# Patient Record
Sex: Male | Born: 1985 | Race: Black or African American | Hispanic: No | Marital: Single | Smoking: Current every day smoker
Health system: Southern US, Community
[De-identification: ages and names within clinical notes are randomized; demographics above are authoritative.]

---
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
## 2004-06-27 ENCOUNTER — Emergency Department (HOSPITAL_COMMUNITY): Admission: EM | Admit: 2004-06-27 | Discharge: 2004-06-27 | Payer: Self-pay | Admitting: Emergency Medicine

## 2004-09-29 ENCOUNTER — Emergency Department (HOSPITAL_COMMUNITY): Admission: EM | Admit: 2004-09-29 | Discharge: 2004-09-30 | Payer: Self-pay | Admitting: Emergency Medicine

## 2005-01-21 ENCOUNTER — Emergency Department (HOSPITAL_COMMUNITY): Admission: EM | Admit: 2005-01-21 | Discharge: 2005-01-22 | Payer: Self-pay | Admitting: Emergency Medicine

## 2005-02-07 ENCOUNTER — Emergency Department (HOSPITAL_COMMUNITY): Admission: EM | Admit: 2005-02-07 | Discharge: 2005-02-07 | Payer: Self-pay | Admitting: Emergency Medicine

## 2005-02-20 ENCOUNTER — Emergency Department (HOSPITAL_COMMUNITY): Admission: EM | Admit: 2005-02-20 | Discharge: 2005-02-20 | Payer: Self-pay | Admitting: Emergency Medicine

## 2005-03-23 ENCOUNTER — Emergency Department (HOSPITAL_COMMUNITY): Admission: EM | Admit: 2005-03-23 | Discharge: 2005-03-23 | Payer: Self-pay | Admitting: Family Medicine

## 2005-08-30 ENCOUNTER — Emergency Department (HOSPITAL_COMMUNITY): Admission: EM | Admit: 2005-08-30 | Discharge: 2005-08-30 | Payer: Self-pay | Admitting: Family Medicine

## 2008-03-27 ENCOUNTER — Emergency Department (HOSPITAL_COMMUNITY): Admission: EM | Admit: 2008-03-27 | Discharge: 2008-03-27 | Payer: Self-pay | Admitting: Family Medicine

## 2010-05-04 ENCOUNTER — Emergency Department (HOSPITAL_COMMUNITY)
Admission: EM | Admit: 2010-05-04 | Discharge: 2010-05-04 | Disposition: A | Discharge status: Home / Self Care | Payer: Self-pay | Attending: Emergency Medicine | Admitting: Emergency Medicine

## 2010-05-04 ENCOUNTER — Emergency Department (HOSPITAL_COMMUNITY): Payer: Self-pay

## 2010-05-04 DIAGNOSIS — F17200 Nicotine dependence, unspecified, uncomplicated: Secondary | ICD-10-CM | POA: Insufficient documentation

## 2010-05-04 DIAGNOSIS — R55 Syncope and collapse: Secondary | ICD-10-CM | POA: Insufficient documentation

## 2010-05-04 DIAGNOSIS — R569 Unspecified convulsions: Secondary | ICD-10-CM | POA: Insufficient documentation

## 2010-05-04 DIAGNOSIS — R51 Headache: Secondary | ICD-10-CM | POA: Insufficient documentation

## 2010-05-04 LAB — COMPREHENSIVE METABOLIC PANEL
ALT: 31 U/L (ref 0–53)
AST: 101 U/L — ABNORMAL HIGH (ref 0–37)
Albumin: 4.5 g/dL (ref 3.5–5.2)
Alkaline Phosphatase: 88 U/L (ref 39–117)
BUN: 6 mg/dL (ref 6–23)
CO2: 30 mEq/L (ref 19–32)
Calcium: 10.3 mg/dL (ref 8.4–10.5)
Chloride: 103 mEq/L (ref 96–112)
Creatinine, Ser: 1.1 mg/dL (ref 0.4–1.5)
GFR calc Af Amer: >60 mL/min (ref >60)
GFR calc non Af Amer: >60 mL/min (ref >60)
Glucose, Bld: 106 mg/dL — ABNORMAL HIGH (ref 70–99)
Potassium: 4.2 mEq/L (ref 3.5–5.1)
Sodium: 143 mEq/L (ref 135–145)
Total Bilirubin: 0.7 mg/dL (ref 0.3–1.2)
Total Protein: 8.4 g/dL — ABNORMAL HIGH (ref 6.0–8.3)

## 2010-05-04 LAB — CBC
HCT: 47.2 % (ref 39.0–52.0)
Hemoglobin: 16.1 g/dL (ref 13.0–17.0)
MCH: 29.9 pg (ref 26.0–34.0)
MCHC: 34.1 g/dL (ref 30.0–36.0)
MCV: 87.7 fL (ref 78.0–100.0)
Platelets: 292 10*3/uL (ref 150–400)
RBC: 5.38 MIL/uL (ref 4.22–5.81)
RDW: 13.3 % (ref 11.5–15.5)
WBC: 15 10*3/uL — ABNORMAL HIGH (ref 4.0–10.5)

## 2010-05-04 LAB — DIFFERENTIAL
Basophils Absolute: 0.2 K/uL — ABNORMAL HIGH (ref 0.0–0.1)
Basophils Relative: 1 % (ref 0–1)
Eosinophils Absolute: 0.2 K/uL (ref 0.0–0.7)
Eosinophils Relative: 1 % (ref 0–5)
Lymphocytes Relative: 17 % (ref 12–46)
Lymphs Abs: 2.6 K/uL (ref 0.7–4.0)
Monocytes Absolute: 2.6 K/uL — ABNORMAL HIGH (ref 0.1–1.0)
Monocytes Relative: 17 % — ABNORMAL HIGH (ref 3–12)
Neutro Abs: 9.4 K/uL — ABNORMAL HIGH (ref 1.7–7.7)
Neutrophils Relative %: 64 % (ref 43–77)

## 2010-05-04 LAB — URINALYSIS, ROUTINE W REFLEX MICROSCOPIC
Bilirubin Urine: NEGATIVE
Hgb urine dipstick: NEGATIVE
Nitrite: NEGATIVE
Protein, ur: 100 mg/dL — AB
Urobilinogen, UA: 0.2 mg/dL (ref 0.0–1.0)

## 2010-05-04 LAB — RAPID URINE DRUG SCREEN, HOSP PERFORMED
Amphetamines: NOT DETECTED
Barbiturates: NOT DETECTED
Benzodiazepines: NOT DETECTED
Opiates: NOT DETECTED
Tetrahydrocannabinol: POSITIVE — AB

## 2010-05-04 LAB — ETHANOL

## 2010-12-27 ENCOUNTER — Inpatient Hospital Stay (INDEPENDENT_AMBULATORY_CARE_PROVIDER_SITE_OTHER)
Admission: RE | Admit: 2010-12-27 | Discharge: 2010-12-27 | Disposition: A | Discharge status: Home / Self Care | Payer: Self-pay | Source: Ambulatory Visit | Attending: Emergency Medicine | Admitting: Emergency Medicine

## 2010-12-27 DIAGNOSIS — J029 Acute pharyngitis, unspecified: Secondary | ICD-10-CM

## 2012-02-27 IMAGING — CT CT HEAD W/O CM
1 series · 16 of 30 positions shown, 20 images · non-contrast
Comparison: None.

CLINICAL DATA: Syncope, seizure and headaches for 1 day.

CT HEAD WITHOUT CONTRAST
TECHNIQUE: Contiguous axial images were obtained from the base of
the skull through the vertex without contrast.

[Series 2: head_seq 4.5 h37s st · axial · 0.43mm/px · z∈[-131,+13]mm · 16 of 36 slices shown, 20 images]
[im 2/36  brain]
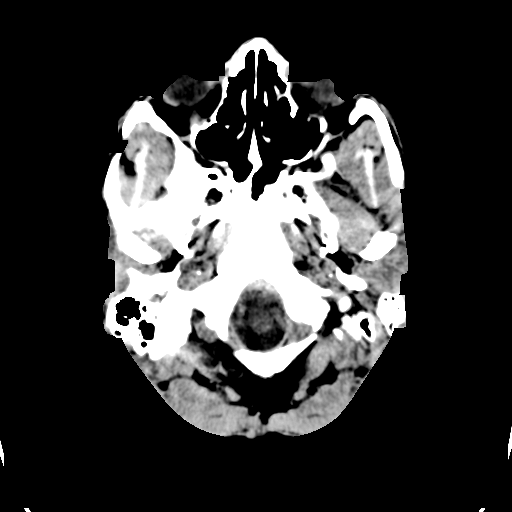
[im 2/36  bone]
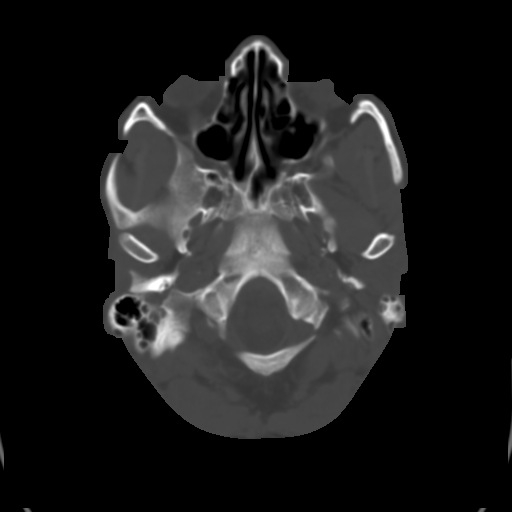
[im 4/36  brain]
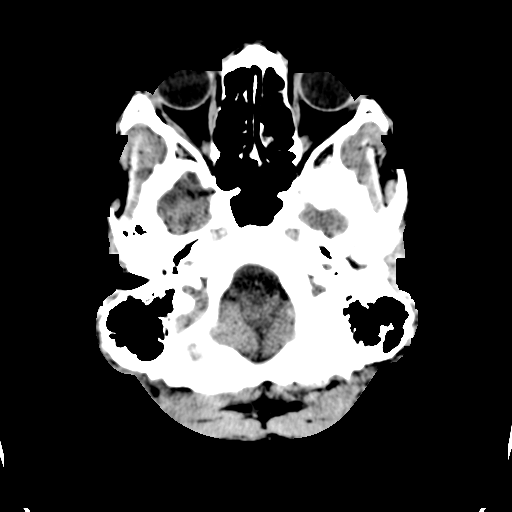
[im 7/36  brain]
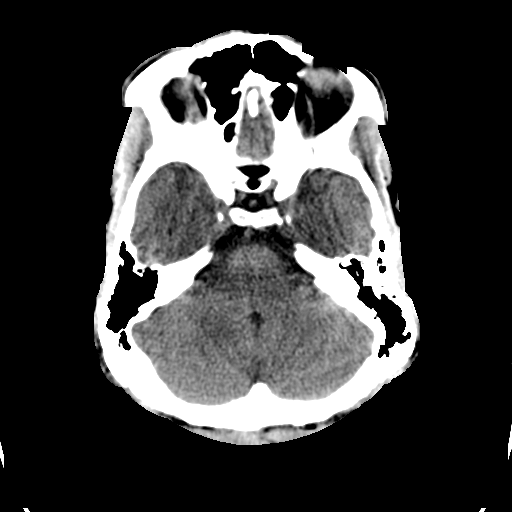
[im 9/36  brain]
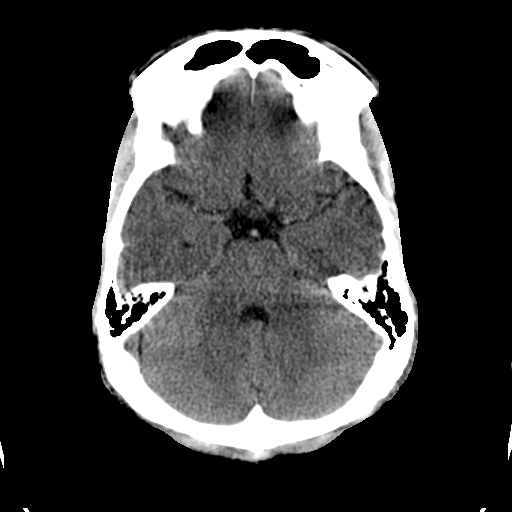
[im 10/36  brain]
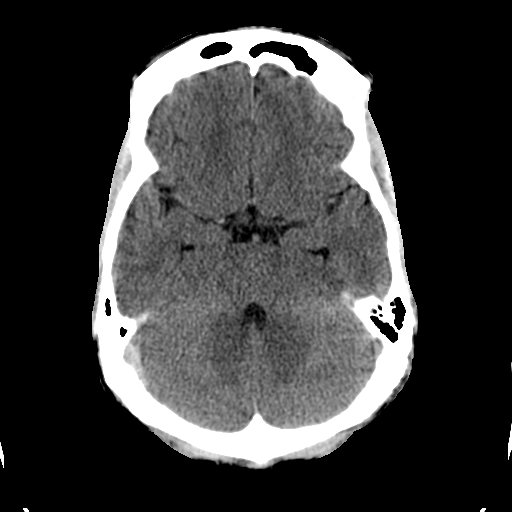
[im 10/36  bone]
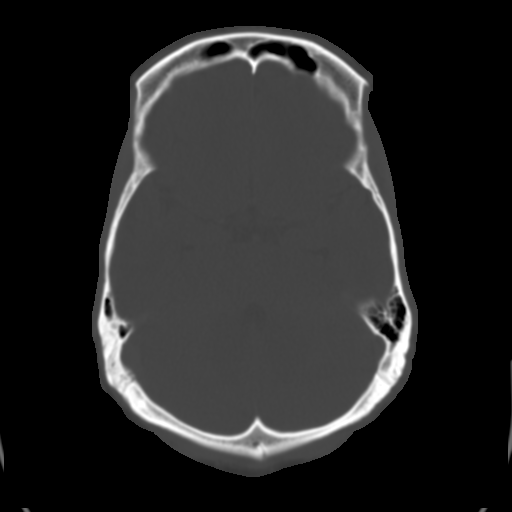
[im 13/36  brain]
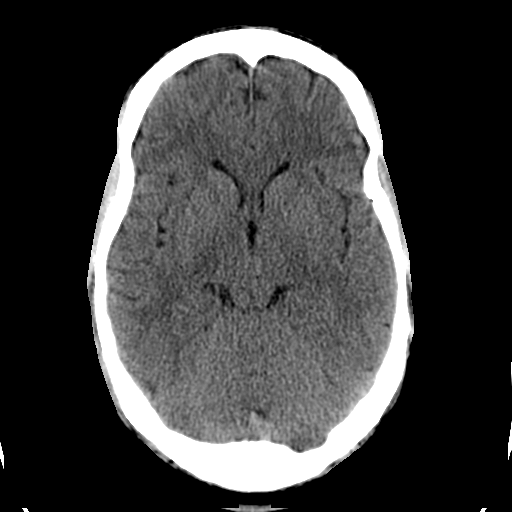
[im 15/36  brain]
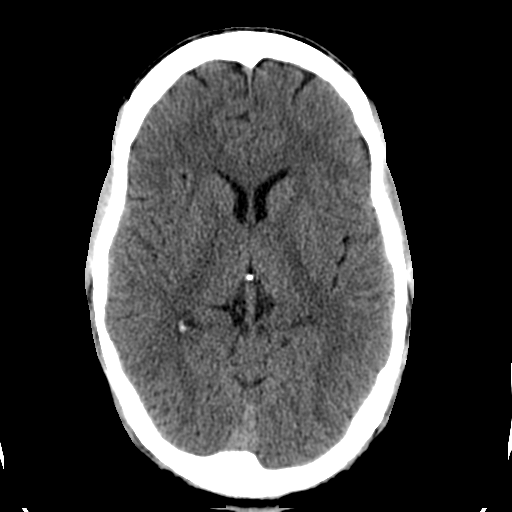
[im 17/36  brain]
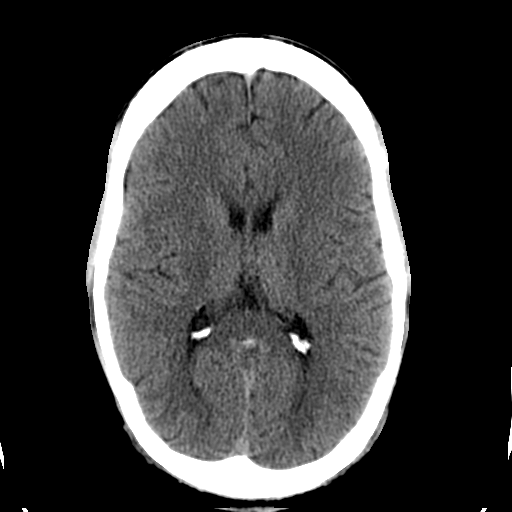
[im 19/36  brain]
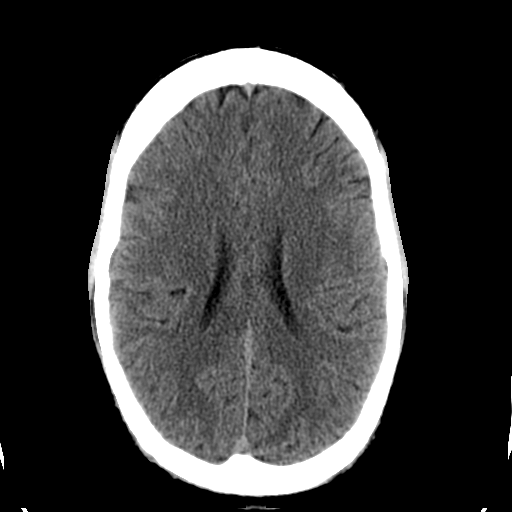
[im 19/36  bone]
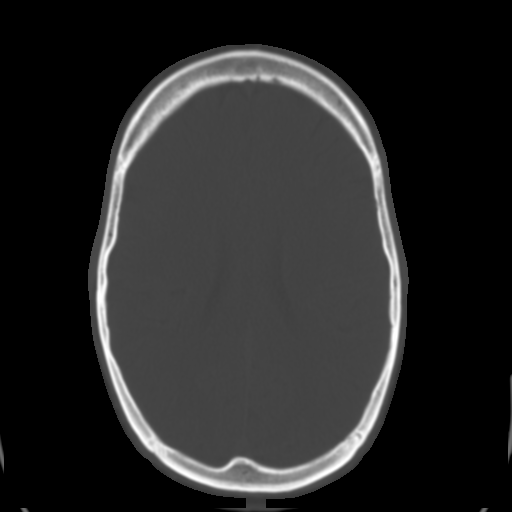
[im 21/36  brain]
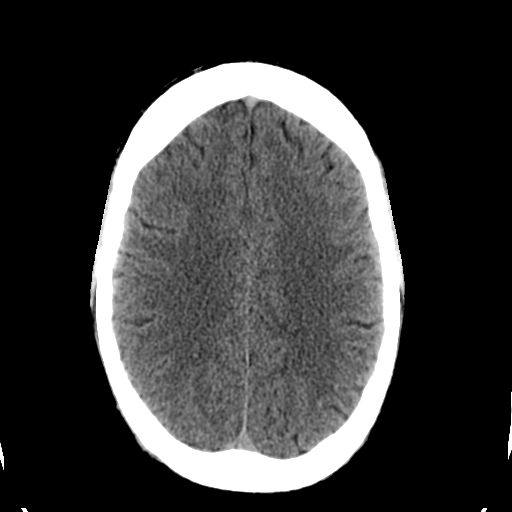
[im 23/36  brain]
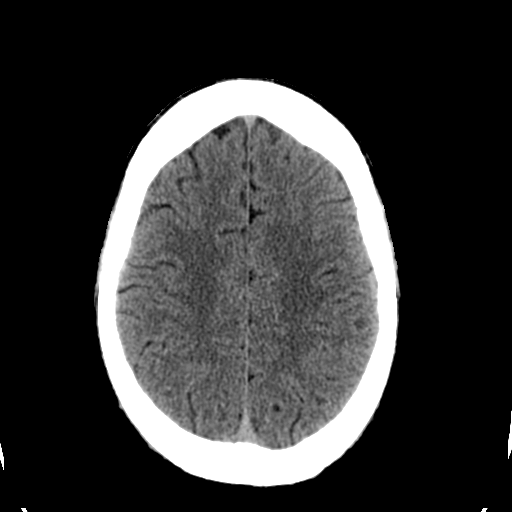
[im 26/36  brain]
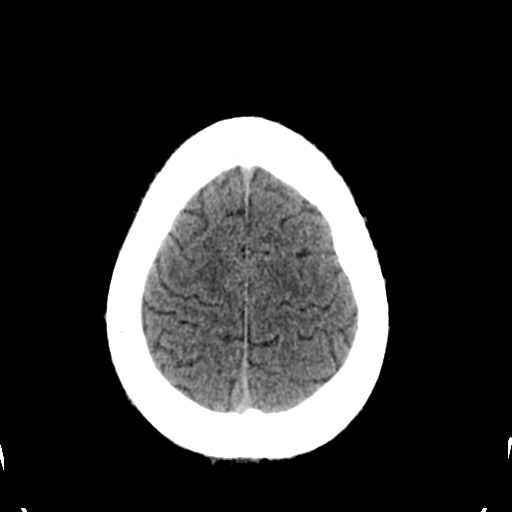
[im 27/36  brain]
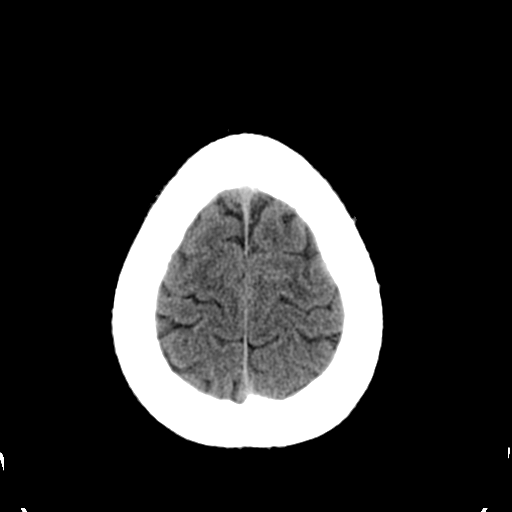
[im 27/36  bone]
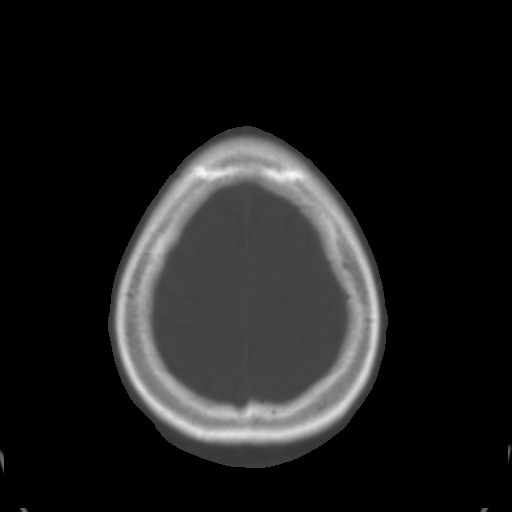
[im 29/36  brain]
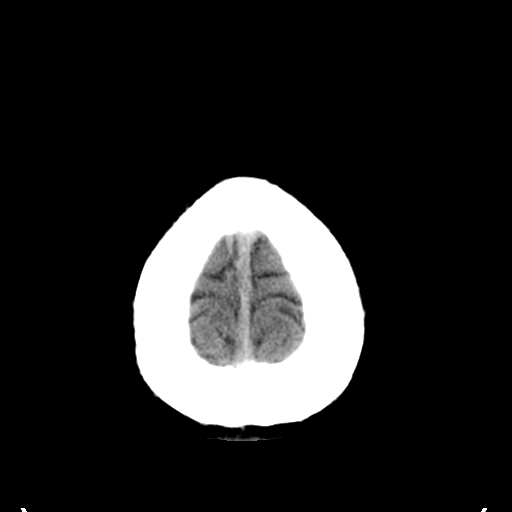
[im 32/36  brain]
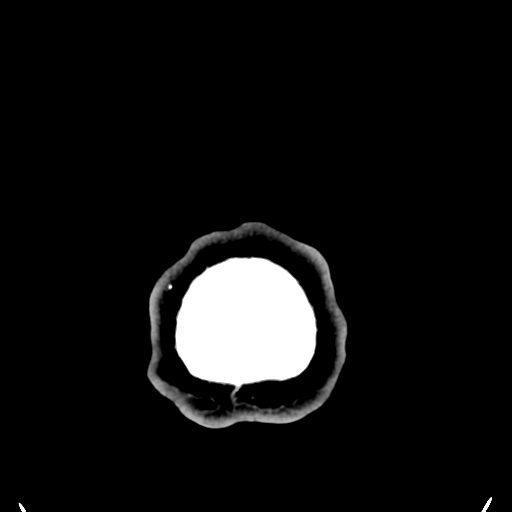
[im 34/36  brain]
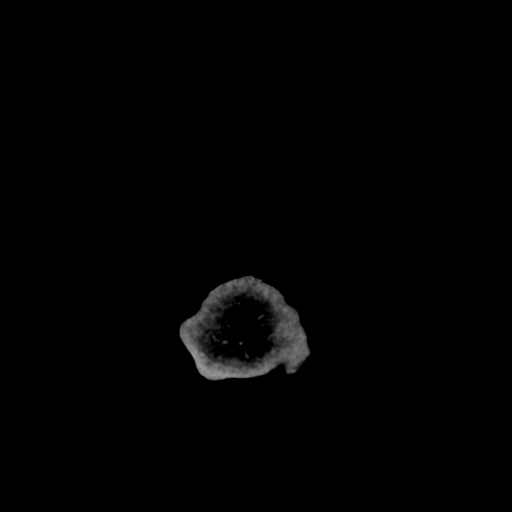

[16 of 30 positions shown; findings below may reference images not displayed]

FINDINGS: There is no evidence of acute intracranial hemorrhage,
mass lesion, brain edema or extra-axial fluid collection.  The
ventricles and subarachnoid spaces are appropriately sized for age.
There is no CT evidence of acute cortical infarction.

The visualized paranasal sinuses are clear. The calvarium is
intact.
IMPRESSION: Unremarkable noncontrast head CT.  Elective MRI may be helpful for
further evaluation of persistent unexplained seizure activity.

## 2014-03-22 ENCOUNTER — Emergency Department (HOSPITAL_COMMUNITY)
Admission: EM | Admit: 2014-03-22 | Discharge: 2014-03-22 | Disposition: A | Discharge status: Home / Self Care | Payer: Self-pay | Attending: Emergency Medicine | Admitting: Emergency Medicine

## 2014-03-22 ENCOUNTER — Encounter (HOSPITAL_COMMUNITY): Payer: Self-pay | Admitting: Emergency Medicine

## 2014-03-22 DIAGNOSIS — A64 Unspecified sexually transmitted disease: Secondary | ICD-10-CM | POA: Insufficient documentation

## 2014-03-22 LAB — URINALYSIS, ROUTINE W REFLEX MICROSCOPIC
Bilirubin Urine: NEGATIVE
Glucose, UA: NEGATIVE mg/dL
Hgb urine dipstick: NEGATIVE
KETONES UR: NEGATIVE mg/dL
NITRITE: NEGATIVE
PH: 7 (ref 5.0–8.0)
Protein, ur: NEGATIVE mg/dL
SPECIFIC GRAVITY, URINE: 1.017 (ref 1.005–1.030)
Urobilinogen, UA: 1 mg/dL (ref 0.0–1.0)

## 2014-03-22 LAB — URINE MICROSCOPIC-ADD ON

## 2014-03-22 MED ORDER — CEFTRIAXONE SODIUM 250 MG IJ SOLR
250.0000 mg | Freq: Once | INTRAMUSCULAR | Status: AC
  Administered 2014-03-22: 250 mg via INTRAMUSCULAR
  Filled 2014-03-22: qty 250

## 2014-03-22 MED ORDER — AZITHROMYCIN 250 MG PO TABS
1000.0000 mg | ORAL_TABLET | Freq: Once | ORAL | Status: AC
  Administered 2014-03-22: 1000 mg via ORAL
  Filled 2014-03-22: qty 4

## 2014-03-22 MED ORDER — LIDOCAINE HCL 1 % IJ SOLN
INTRAMUSCULAR | Status: AC
Start: 2014-03-22 — End: 2014-03-22
  Administered 2014-03-22: 20 mL
  Filled 2014-03-22: qty 20

## 2014-03-22 NOTE — ED Notes (Signed)
Pt c/o dysuria onset this morning, with clear thick pus draining from his penis. Pt endorses unprotected intercourse 4 days ago.

## 2014-03-22 NOTE — ED Provider Notes (Signed)
CSN: [id number]     Arrival date & time 03/22/14  1742 History  This chart was scribed for non-physician practitioner, Earley Favor, FNP,working with Toy Baker, MD, by Karle Plumber, ED Scribe. This patient was seen in room WTR6/WTR6 and the patient's care was started at 8:44 PM.  No chief complaint on file.  The history is provided by the patient. No language interpreter was used.    HPI Comments:  Ricardo Pope is a 29 y.o. male who presents to the Emergency Department complaining of a clear, thick discharge from his penis that he noticed this morning. Reports associated dysuria. He reports he had unprotected sex with a male four days ago and states she told him two days ago that he needs to be checked. Denies testicular pain, abdominal pain, fever, chills, nausea or vomiting.   History reviewed. No pertinent past medical history. No past surgical history on file. History reviewed. No pertinent family history. History  Substance Use Topics  . Smoking status: Not on file  . Smokeless tobacco: Not on file  . Alcohol Use: Not on file    Review of Systems  Constitutional: Negative for fever and chills.  Gastrointestinal: Negative for nausea, vomiting and abdominal pain.  Genitourinary: Positive for dysuria and discharge. Negative for testicular pain.    Allergies  Review of patient's allergies indicates no known allergies.  Home Medications   Prior to Admission medications   Not on File   Triage Vitals: BP 157/90 mmHg  Pulse 105  Temp(Src) 97.9 F (36.6 C) (Oral)  Resp 16  SpO2 100% Physical Exam  Constitutional: He is oriented to person, place, and time. He appears well-developed and well-nourished.  HENT:  Head: Normocephalic and atraumatic.  Eyes: EOM are normal.  Neck: Normal range of motion.  Cardiovascular: Normal rate.   Pulmonary/Chest: Effort normal.  Genitourinary: Circumcised. Discharge found.  Musculoskeletal: Normal range of motion.  Neurological:  He is alert and oriented to person, place, and time.  Skin: Skin is warm and dry.  Psychiatric: He has a normal mood and affect. His behavior is normal.  Nursing note and vitals reviewed.   ED Course  Procedures (including critical care time) DIAGNOSTIC STUDIES: Oxygen Saturation is 100% on RA, normal by my interpretation.   COORDINATION OF CARE: 8:48 PM- Will check and treat for GC/chlamydia and HIV. Pt verbalizes understanding and agrees to plan.  Medications  cefTRIAXone (ROCEPHIN) injection 250 mg (250 mg Intramuscular Given 03/22/14 2108)  azithromycin (ZITHROMAX) tablet 1,000 mg (1,000 mg Oral Given 03/22/14 2107)  lidocaine (XYLOCAINE) 1 % (with pres) injection (20 mLs  Given 03/22/14 2108)    Labs Review Labs Reviewed  GC/CHLAMYDIA PROBE AMP  URINALYSIS, ROUTINE W REFLEX MICROSCOPIC  HIV ANTIBODY (ROUTINE TESTING)  RPR    Imaging Review No results found.   EKG Interpretation None      MDM  Will treat for GC and Chlamydia Final diagnoses:  STD (male)       I personally performed the services described in this documentation, which was scribed in my presence. The recorded information has been reviewed and is accurate.    Arman Filter, NP 03/22/14 2113  Arman Filter, NP 03/22/14 2120  Toy Baker, MD 03/25/14 [phone]

## 2014-03-22 NOTE — Discharge Instructions (Signed)
Sexually Transmitted Disease A sexually transmitted disease (STD) is a disease or infection often passed to another person during sex. However, STDs can be passed through nonsexual ways. An STD can be passed through:  Spit (saliva).  Semen.  Blood.  Mucus from the vagina.  Pee (urine). HOW CAN I LESSEN MY CHANCES OF GETTING AN STD?  Use:  Latex condoms.  Water-soluble lubricants with condoms. Do not use petroleum jelly or oils.  Dental dams. These are small pieces of latex that are used as a barrier during oral sex.  Avoid having more than one sex partner.  Do not have sex with someone who has other sex partners.  Do not have sex with anyone you do not know or who is at high risk for an STD.  Avoid risky sex that can break your skin.  Do not have sex if you have open sores on your mouth or skin.  Avoid drinking too much alcohol or taking illegal drugs. Alcohol and drugs can affect your good judgment.  Avoid oral and anal sex acts.  Get shots (vaccines) for HPV and hepatitis.  If you are at risk of being infected with HIV, it is advised that you take a certain medicine daily to prevent HIV infection. This is called pre-exposure prophylaxis (PrEP). You may be at risk if:  You are a man who has sex with other men (MSM).  You are attracted to the opposite sex (heterosexual) and are having sex with more than one partner.  You take drugs with a needle.  You have sex with someone who has HIV.  Talk with your doctor about if you are at high risk of being infected with HIV. If you begin to take PrEP, get tested for HIV first. Get tested every 3 months for as long as you are taking PrEP. WHAT SHOULD I DO IF I THINK I HAVE AN STD?  See your doctor.  Tell your sex partner(s) that you have an STD. They should be tested and treated.  Do not have sex until your doctor says it is okay. WHEN SHOULD I GET HELP? Get help right away if:  You have bad belly (abdominal)  pain.  You are a man and have puffiness (swelling) or pain in your testicles.  You are a woman and have puffiness in your vagina. Document Released: 04/12/2004 Document Revised: 03/10/2013 Document Reviewed: 08/29/2012 Exit[employer] Patient Information 2015 [employer]. This information is not intended to replace advice given to you by your health care provider. Make sure you discuss any questions you have with your health care provider.  

## 2014-03-23 LAB — RPR

## 2014-03-23 LAB — HIV ANTIBODY (ROUTINE TESTING W REFLEX): HIV: NONREACTIVE

## 2014-03-24 LAB — GC/CHLAMYDIA PROBE AMP
CT PROBE, AMP APTIMA: NEGATIVE
GC Probe RNA: NEGATIVE

## 2015-02-21 ENCOUNTER — Encounter: Payer: Self-pay | Admitting: Family Medicine

## 2017-07-10 ENCOUNTER — Encounter (HOSPITAL_COMMUNITY): Payer: Self-pay

## 2017-07-10 ENCOUNTER — Other Ambulatory Visit: Payer: Self-pay

## 2017-07-10 DIAGNOSIS — J189 Pneumonia, unspecified organism: Secondary | ICD-10-CM | POA: Insufficient documentation

## 2017-07-10 DIAGNOSIS — R0781 Pleurodynia: Secondary | ICD-10-CM | POA: Diagnosis present

## 2017-07-10 DIAGNOSIS — F17210 Nicotine dependence, cigarettes, uncomplicated: Secondary | ICD-10-CM | POA: Insufficient documentation

## 2017-07-10 LAB — CBC
HCT: 40.7 % (ref 39.0–52.0)
Hemoglobin: 13.8 g/dL (ref 13.0–17.0)
MCH: 30.3 pg (ref 26.0–34.0)
MCHC: 33.9 g/dL (ref 30.0–36.0)
MCV: 89.3 fL (ref 78.0–100.0)
PLATELETS: 252 10*3/uL (ref 150–400)
RBC: 4.56 MIL/uL (ref 4.22–5.81)
RDW: 13.8 % (ref 11.5–15.5)
WBC: 26.4 10*3/uL — ABNORMAL HIGH (ref 4.0–10.5)

## 2017-07-10 LAB — COMPREHENSIVE METABOLIC PANEL
ALT: 14 U/L — AB (ref 17–63)
AST: 22 U/L (ref 15–41)
Albumin: 4.4 g/dL (ref 3.5–5.0)
Alkaline Phosphatase: 54 U/L (ref 38–126)
Anion gap: 11 (ref 5–15)
BUN: 8 mg/dL (ref 6–20)
CO2: 27 mmol/L (ref 22–32)
CREATININE: 0.91 mg/dL (ref 0.61–1.24)
Calcium: 9.2 mg/dL (ref 8.9–10.3)
Chloride: 101 mmol/L (ref 101–111)
GFR calc Af Amer: >60 mL/min (ref >60)
Glucose, Bld: 126 mg/dL — ABNORMAL HIGH (ref 65–99)
Potassium: 4.3 mmol/L (ref 3.5–5.1)
SODIUM: 139 mmol/L (ref 135–145)
Total Bilirubin: 1.4 mg/dL — ABNORMAL HIGH (ref 0.3–1.2)
Total Protein: 7.8 g/dL (ref 6.5–8.1)

## 2017-07-10 LAB — LIPASE, BLOOD: Lipase: 20 U/L (ref 11–51)

## 2017-07-10 NOTE — ED Notes (Signed)
Patient refused zofran ODT when offered in triage.

## 2017-07-10 NOTE — ED Triage Notes (Signed)
Patient states he began having fever, chills, vomiting, and rib cage pain since approx 0600 today. Pain is worse in ribs when he takes a deep breath.

## 2017-07-11 ENCOUNTER — Emergency Department (HOSPITAL_COMMUNITY)
Admission: EM | Admit: 2017-07-11 | Discharge: 2017-07-11 | Disposition: A | Discharge status: Home / Self Care | Payer: BLUE CROSS/BLUE SHIELD | Attending: Emergency Medicine | Admitting: Emergency Medicine

## 2017-07-11 ENCOUNTER — Emergency Department (HOSPITAL_COMMUNITY): Payer: BLUE CROSS/BLUE SHIELD

## 2017-07-11 DIAGNOSIS — J189 Pneumonia, unspecified organism: Secondary | ICD-10-CM

## 2017-07-11 DIAGNOSIS — J181 Lobar pneumonia, unspecified organism: Secondary | ICD-10-CM

## 2017-07-11 LAB — D-DIMER, QUANTITATIVE: D-Dimer, Quant: 0.48 ug/mL-FEU (ref 0.00–0.50)

## 2017-07-11 MED ORDER — AMOXICILLIN-POT CLAVULANATE 875-125 MG PO TABS: 1.0000 | ORAL_TABLET | Freq: Two times a day (BID) | ORAL | 0 refills | Status: DC

## 2017-07-11 MED ORDER — SODIUM CHLORIDE 0.9 % IV BOLUS
1000.0000 mL | Freq: Once | INTRAVENOUS | Status: AC
  Administered 2017-07-11: 1000 mL via INTRAVENOUS

## 2017-07-11 MED ORDER — AZITHROMYCIN 250 MG PO TABS
500.0000 mg | ORAL_TABLET | Freq: Once | ORAL | Status: AC
  Administered 2017-07-11: 500 mg via ORAL
  Filled 2017-07-11: qty 2

## 2017-07-11 MED ORDER — NAPROXEN 500 MG PO TABS: 500.0000 mg | ORAL_TABLET | Freq: Two times a day (BID) | ORAL | 0 refills | Status: DC | PRN

## 2017-07-11 MED ORDER — KETOROLAC TROMETHAMINE 30 MG/ML IJ SOLN
30.0000 mg | Freq: Once | INTRAMUSCULAR | Status: AC
  Administered 2017-07-11: 30 mg via INTRAVENOUS
  Filled 2017-07-11: qty 1

## 2017-07-11 MED ORDER — AZITHROMYCIN 250 MG PO TABS: 250.0000 mg | ORAL_TABLET | Freq: Every day | ORAL | 0 refills | Status: DC

## 2017-07-11 MED ORDER — AMOXICILLIN-POT CLAVULANATE 875-125 MG PO TABS
1.0000 | ORAL_TABLET | Freq: Once | ORAL | Status: AC
  Administered 2017-07-11: 1 via ORAL
  Filled 2017-07-11: qty 1

## 2017-07-11 MED ORDER — HYDROCODONE-ACETAMINOPHEN 5-325 MG PO TABS: 1.0000 | ORAL_TABLET | Freq: Four times a day (QID) | ORAL | 0 refills | Status: DC | PRN

## 2017-07-11 NOTE — ED Notes (Signed)
Ambulate pt from room to bathroom and back 99%

## 2017-07-11 NOTE — ED Notes (Signed)
Pt informed that urine sample is needed. 

## 2017-07-11 NOTE — Discharge Instructions (Addendum)
Your work-up in the emergency department today is concerning for pneumonia.  We recommend the use of an incentive spirometer at least once per hour while awake to ensure that you are taking deep breaths.  Continue antibiotics as prescribed until finished.  You have been prescribed naproxen to take for pain control.  You may supplement this with Norco as needed for severe pain.    We recommend follow-up with a primary care doctor to ensure resolution of symptoms.  Should you experience worsening symptoms such as increased fever, worsening shortness of breath, coughing up blood, loss of consciousness, return to the emergency department for continued and prompt evaluation.

## 2017-07-11 NOTE — ED Provider Notes (Signed)
[facility]-EMERGENCY DEPT Provider Note   CSN: [id number] Arrival date & time: 07/10/17  1545    History   Chief Complaint Chief Complaint  Patient presents with  . Emesis  . pain in ribs  . Fever  . Chills    HPI Ricardo Pope is a 32 y.o. male.   32 year old male presents to the emergency department for evaluation of pleuritic chest pain.  He reports sudden onset of right sided chest pain at 6 AM yesterday which woke him from sleep.  Patient with feeling of generalized fatigue and malaise upon waking as well.  He felt hot with chills, but did not take his temperature to note if he had a fever.  Patient experienced 4 episodes of vomiting at onset of his symptoms shortly after waking.  He has not experienced any additional nausea or vomiting, but continues to have sharp chest pain with coughing, deep breathing.  It is slightly improved when extending his back to stretch his abdomen.  He took 2 tablets of Advil with no relief of his discomfort.  Patient denies any bowel changes, urinary symptoms, history of abdominal surgeries, recent surgeries or hospitalizations, leg swelling.  He reports being in his normal state of health before going to bed.  He had eaten baked chicken, green beans, rice, potato chips, and a honey bun before going to bed.     History reviewed. No pertinent past medical history.  There are no active problems to display for this patient.   History reviewed. No pertinent surgical history.      Home Medications    Prior to Admission medications   Medication Sig Start Date End Date Taking? Authorizing Provider  amoxicillin-clavulanate (AUGMENTIN) 875-125 MG tablet Take 1 tablet by mouth every 12 (twelve) hours. 07/11/17   Antony Madura, PA-C  azithromycin (ZITHROMAX) 250 MG tablet Take 1 tablet (250 mg total) by mouth daily. Take 1 every day until finished, beginning on the morning of 07/12/17. 07/11/17   Antony Madura, PA-C    HYDROcodone-acetaminophen (NORCO/VICODIN) 5-325 MG tablet Take 1 tablet by mouth every 6 (six) hours as needed for severe pain. 07/11/17   Antony Madura, PA-C  naproxen (NAPROSYN) 500 MG tablet Take 1 tablet (500 mg total) by mouth every 12 (twelve) hours as needed for mild pain or moderate pain. 07/11/17   Antony Madura, PA-C    Family History History reviewed. No pertinent family history.  Social History Social History   Tobacco Use  . Smoking status: Current Every Day Smoker    Packs/day: 0.25    Types: Cigarettes  . Smokeless tobacco: Never Used  Substance Use Topics  . Alcohol use: Yes  . Drug use: Never     Allergies   Patient has no known allergies.   Review of Systems Review of Systems Ten systems reviewed and are negative for acute change, except as noted in the HPI.    Physical Exam Updated Vital Signs BP 99/64   Pulse 74   Temp 99.2 F (37.3 C) (Oral)   Resp 19   Ht 5\' 9"  (1.753 m)   Wt 62.6 kg (138 lb)   SpO2 97%   BMI 20.38 kg/m   Physical Exam  Constitutional: He is oriented to person, place, and time. He appears well-developed and well-nourished. No distress.  Nontoxic appearing and in no acute distress  HENT:  Head: Normocephalic and atraumatic.  Eyes: Conjunctivae and EOM are normal. No scleral icterus.  Neck: Normal range of motion.  Cardiovascular: Normal rate, regular rhythm and intact distal pulses.  Patient not tachycardic as noted in triage  Pulmonary/Chest: Effort normal. No stridor. No respiratory distress. He has no wheezes. He has no rales.  Lungs CTAB  Abdominal:  Abdomen soft, nondistended.  No palpable tenderness.  Negative Murphy sign.  No peritoneal signs or palpable masses on exam.  Musculoskeletal: Normal range of motion.  Neurological: He is alert and oriented to person, place, and time. He exhibits normal muscle tone. Coordination normal.  GCS 15. Patient moving all extremities spontaneously.  Skin: Skin is warm and dry. No  rash noted. He is not diaphoretic. No erythema. No pallor.  Psychiatric: He has a normal mood and affect. His behavior is normal.  Nursing note and vitals reviewed.    ED Treatments / Results  Labs (all labs ordered are listed, but only abnormal results are displayed) Labs Reviewed  COMPREHENSIVE METABOLIC PANEL - Abnormal; Notable for the following components:      Result Value   Glucose, Bld 126 (*)    ALT 14 (*)    Total Bilirubin 1.4 (*)    All other components within normal limits  CBC - Abnormal; Notable for the following components:   WBC 26.4 (*)    All other components within normal limits  LIPASE, BLOOD  D-DIMER, QUANTITATIVE (NOT AT [facility])    EKG None  Radiology Dg Chest 2 View  Result Date: 07/11/2017 CLINICAL DATA:  Right upper quadrant pain, nausea, and fever for several days. EXAM: CHEST - 2 VIEW COMPARISON:  None. FINDINGS: Consolidation in the right middle lung consistent with pneumonia. Heart size and pulmonary vascularity are normal. Left lung is clear. No blunting of costophrenic angles. No pneumothorax. Mediastinal contours appear intact. IMPRESSION: Consolidation in the right middle lung likely represents pneumonia. Electronically Signed   By: Burman NievesWilliam  Stevens M.D.   On: 07/11/2017 01:03   [place] Abdomen Complete  Result Date: 07/11/2017 CLINICAL DATA:  Right upper quadrant pain for 1 day. EXAM: ABDOMEN ULTRASOUND COMPLETE COMPARISON:  None. FINDINGS: Gallbladder: Multiple small gallbladder polyps, largest measuring about 4 mm diameter. Based on size criteria, these are likely benign. No gallstones or wall thickening. Murphy's sign is negative. Common bile duct: Diameter: 4.3 mm, normal Liver: No focal lesion identified. Within normal limits in parenchymal echogenicity. Portal vein is patent on color Doppler imaging with normal direction of blood flow towards the liver. IVC: No abnormality visualized. Pancreas: Visualized portion unremarkable. Spleen: Size and  appearance within normal limits. Right Kidney: Length: 11.3 cm. Echogenicity within normal limits. No mass or hydronephrosis visualized. Left Kidney: Length: 11.8 cm. Echogenicity within normal limits. No mass or hydronephrosis visualized. Abdominal aorta: No aneurysm visualized. Other findings: None. IMPRESSION: 1. Multiple gallbladder polyps measuring up to 4 mm diameter, likely benign. Multiple polyps can be seen with adenomyomatosis. 2. No evidence of cholelithiasis or cholecystitis. Electronically Signed   By: Burman NievesWilliam  Stevens M.D.   On: 07/11/2017 01:39    Procedures Procedures (including critical care time)  Medications Ordered in ED Medications  sodium chloride 0.9 % bolus 1,000 mL (0 mLs Intravenous Stopped 07/11/17 0105)  ketorolac (TORADOL) 30 MG/ML injection 30 mg (30 mg Intravenous Given 07/11/17 0229)  azithromycin (ZITHROMAX) tablet 500 mg (500 mg Oral Given 07/11/17 0228)  amoxicillin-clavulanate (AUGMENTIN) 875-125 MG per tablet 1 tablet (1 tablet Oral Given 07/11/17 0228)  sodium chloride 0.9 % bolus 1,000 mL (0 mLs Intravenous Stopped 07/11/17 0300)    4:27 AM Patient ambulatory in the emergency  department with oxygen saturations of 99%.   Initial Impression / Assessment and Plan / ED Course  I have reviewed the triage vital signs and the nursing notes.  Pertinent labs & imaging results that were available during my care of the patient were reviewed by me and considered in my medical decision making (see chart for details).     32 year old male presents to the emergency department for complaints of right sided pleuritic chest pain.  This began early this morning after returning home from work.  Patient reports feelings of generalized fatigue with subjective fever.  He has been afebrile in the emergency department.  Lungs grossly clear on assessment.  No reproducible abdominal tenderness.  Patient seems to have history of leukocytosis at baseline; however, this is more  significantly elevated today up to 26.4.  No electro light derangements.  Liver and kidney function preserved.  Lipase normal.  Given pleuritic nature of symptoms, a d-dimer was ordered as patient was tachycardic on arrival to the emergency department.  D-dimer today is negative.  Patient with low pretest probability for pulmonary embolus.  I do not believe further evaluation with CT scan is indicated.  He was found to have findings concerning for right middle lobe opacity consistent with pneumonia.  In light of fatigue, leukocytosis will treat with antibiotics.  Patient is able to ambulate in the emergency department without hypoxia.  He has been started on Augmentin and azithromycin.  Patient provided an incentive spirometer to use at least once per hour while awake.  He is stable for follow-up with a primary care doctor.  Return precautions discussed and provided. Patient discharged in stable condition with no unaddressed concerns.   Final Clinical Impressions(s) / ED Diagnoses   Final diagnoses:  Community acquired pneumonia of right middle lobe of lung [facility])    ED Discharge Orders        Ordered    azithromycin (ZITHROMAX) 250 MG tablet  Daily     07/11/17 0430    amoxicillin-clavulanate (AUGMENTIN) 875-125 MG tablet  Every 12 hours     07/11/17 0430    HYDROcodone-acetaminophen (NORCO/VICODIN) 5-325 MG tablet  Every 6 hours PRN     07/11/17 0430    naproxen (NAPROSYN) 500 MG tablet  Every 12 hours PRN     07/11/17 0430       Antony Madura, PA-C 07/11/17 0651    Ward, Layla Maw, DO 07/11/17 [id number]

## 2018-05-06 ENCOUNTER — Other Ambulatory Visit: Payer: Self-pay

## 2018-05-06 ENCOUNTER — Encounter (HOSPITAL_COMMUNITY): Payer: Self-pay

## 2018-05-06 ENCOUNTER — Ambulatory Visit (HOSPITAL_COMMUNITY)
Admission: EM | Admit: 2018-05-06 | Discharge: 2018-05-06 | Disposition: A | Discharge status: Home / Self Care | Payer: BLUE CROSS/BLUE SHIELD | Attending: Family Medicine | Admitting: Family Medicine

## 2018-05-06 DIAGNOSIS — B9789 Other viral agents as the cause of diseases classified elsewhere: Secondary | ICD-10-CM

## 2018-05-06 DIAGNOSIS — J069 Acute upper respiratory infection, unspecified: Secondary | ICD-10-CM | POA: Diagnosis not present

## 2018-05-06 MED ORDER — BENZONATATE 200 MG PO CAPS: 200.0000 mg | ORAL_CAPSULE | Freq: Two times a day (BID) | ORAL | 0 refills | Status: DC | PRN

## 2018-05-06 MED ORDER — DM-GUAIFENESIN ER 30-600 MG PO TB12: 1.0000 | ORAL_TABLET | Freq: Two times a day (BID) | ORAL | 0 refills | Status: DC

## 2018-05-06 NOTE — ED Triage Notes (Addendum)
Pt cc chest congestion and a scratchy  throat x 2 days.

## 2018-05-06 NOTE — Discharge Instructions (Addendum)
Drink plenty of fluids Take both Mucinex and Tessalon every 12 hours Use a humidifier you have one Take ibuprofen 800 mg 3 times a day as needed for any pain or fever Expect improvement over the next few days

## 2018-05-06 NOTE — ED Provider Notes (Signed)
[facility]    CSN: [id number] Arrival date & time: 05/06/18  1555     History   Chief Complaint Chief Complaint  Patient presents with  . chest congestion    HPI Ricardo Pope is a 33 y.o. male.   HPI  Patient states he has a "cold".  Cough cold runny nose chest congestion for 2 days.  No fever chills.  No nausea or vomiting.  No known exposure to influenza or strep.  He states that the people at work of been sick.  He missed work today. He is a smoker and is advised to quit. He is otherwise in good health and states he is not on any ongoing prescription medication  History reviewed. No pertinent past medical history.  There are no active problems to display for this patient.   History reviewed. No pertinent surgical history.     Home Medications    Prior to Admission medications   Medication Sig Start Date End Date Taking? Authorizing Provider  benzonatate (TESSALON) 200 MG capsule Take 1 capsule (200 mg total) by mouth 2 (two) times daily as needed for cough. 05/06/18   Eustace Moore, MD  dextromethorphan-guaiFENesin [facility] DM) 30-600 MG 12hr tablet Take 1 tablet by mouth 2 (two) times daily. 05/06/18   Eustace Moore, MD    Family History History reviewed. No pertinent family history.  Social History Social History   Tobacco Use  . Smoking status: Current Every Day Smoker    Packs/day: 0.25    Types: Cigarettes  . Smokeless tobacco: Never Used  Substance Use Topics  . Alcohol use: Yes  . Drug use: Never     Allergies   Patient has no known allergies.   Review of Systems Review of Systems  Constitutional: Negative for chills and fever.  HENT: Positive for congestion. Negative for ear pain and sore throat.   Eyes: Negative for pain and visual disturbance.  Respiratory: Positive for cough. Negative for shortness of breath.   Cardiovascular: Negative for chest pain and palpitations.  Gastrointestinal: Negative for abdominal  pain and vomiting.  Genitourinary: Negative for dysuria and hematuria.  Musculoskeletal: Negative for arthralgias and back pain.  Skin: Negative for color change and rash.  Neurological: Negative for seizures and syncope.  All other systems reviewed and are negative.    Physical Exam Triage Vital Signs ED Triage Vitals  Enc Vitals Group     BP 05/06/18 1609 138/80     Pulse Rate 05/06/18 1609 88     Resp 05/06/18 1609 18     Temp 05/06/18 1609 98.8 F (37.1 C)     Temp Source 05/06/18 1609 Oral     SpO2 05/06/18 1609 100 %     Weight 05/06/18 1607 137 lb (62.1 kg)     Height --      Head Circumference --      Peak Flow --      Pain Score 05/06/18 1607 4     Pain Loc --      Pain Edu? --      Excl. in GC? --    No data found.  Updated Vital Signs BP 138/80 (BP Location: Right Arm)   Pulse 88   Temp 98.8 F (37.1 C) (Oral)   Resp 18   Wt 62.1 kg   SpO2 100%   BMI 20.23 kg/m     Physical Exam Constitutional:      General: He is not in acute  distress.    Appearance: He is well-developed and normal weight.  HENT:     Head: Normocephalic and atraumatic.     Right Ear: Tympanic membrane and ear canal normal.     Left Ear: Tympanic membrane and ear canal normal.     Nose: Congestion and rhinorrhea present.     Comments: Clear Eyes:     Conjunctiva/sclera: Conjunctivae normal.     Pupils: Pupils are equal, round, and reactive to light.  Neck:     Musculoskeletal: Normal range of motion.  Cardiovascular:     Rate and Rhythm: Normal rate and regular rhythm.     Heart sounds: Normal heart sounds.  Pulmonary:     Effort: Pulmonary effort is normal. No respiratory distress.     Breath sounds: Normal breath sounds.     Comments: Lungs are clear Abdominal:     General: There is no distension.     Palpations: Abdomen is soft.  Musculoskeletal: Normal range of motion.  Lymphadenopathy:     Cervical: Cervical adenopathy present.  Skin:    General: Skin is warm and  dry.  Neurological:     Mental Status: He is alert.  Psychiatric:        Mood and Affect: Mood normal.        Behavior: Behavior normal.      UC Treatments / Results  Labs (all labs ordered are listed, but only abnormal results are displayed) Labs Reviewed - No data to display  EKG None  Radiology No results found.  Procedures Procedures (including critical care time)  Medications Ordered in UC Medications - No data to display  Initial Impression / Assessment and Plan / UC Course  I have reviewed the triage vital signs and the nursing notes.  Pertinent labs & imaging results that were available during my care of the patient were reviewed by me and considered in my medical decision making (see chart for details).    Discussed with patient that he has a "chest cold" which is viral.  Antibiotics will not make him get better faster.  We will treat his symptoms.  Expect improvement over a few days.  Return if worse instead of better f Final Clinical Impressions(s) / UC Diagnoses   Final diagnoses:  Viral upper respiratory tract infection with cough     Discharge Instructions     Drink plenty of fluids Take both Mucinex and Tessalon every 12 hours Use a humidifier you have one Take ibuprofen 800 mg 3 times a day as needed for any pain or fever Expect improvement over the next few days    ED Prescriptions    Medication Sig Dispense Auth. Provider   benzonatate (TESSALON) 200 MG capsule Take 1 capsule (200 mg total) by mouth 2 (two) times daily as needed for cough. 20 capsule Eustace Moore, MD   dextromethorphan-guaiFENesin [id number] DM) 30-600 MG 12hr tablet Take 1 tablet by mouth 2 (two) times daily. 20 tablet Eustace Moore, MD     Controlled Substance Prescriptions [place] Controlled Substance Registry consulted? Not Applicable   Eustace Moore, MD 05/06/18 1655

## 2018-06-09 ENCOUNTER — Encounter (HOSPITAL_COMMUNITY): Payer: Self-pay | Admitting: Emergency Medicine

## 2018-06-09 ENCOUNTER — Ambulatory Visit (INDEPENDENT_AMBULATORY_CARE_PROVIDER_SITE_OTHER): Payer: BLUE CROSS/BLUE SHIELD

## 2018-06-09 ENCOUNTER — Ambulatory Visit (HOSPITAL_COMMUNITY)
Admission: EM | Admit: 2018-06-09 | Discharge: 2018-06-09 | Disposition: A | Discharge status: Home / Self Care | Payer: BLUE CROSS/BLUE SHIELD | Attending: Internal Medicine | Admitting: Internal Medicine

## 2018-06-09 ENCOUNTER — Other Ambulatory Visit: Payer: Self-pay

## 2018-06-09 DIAGNOSIS — R69 Illness, unspecified: Secondary | ICD-10-CM | POA: Insufficient documentation

## 2018-06-09 DIAGNOSIS — R05 Cough: Secondary | ICD-10-CM | POA: Insufficient documentation

## 2018-06-09 DIAGNOSIS — R059 Cough, unspecified: Secondary | ICD-10-CM

## 2018-06-09 DIAGNOSIS — J111 Influenza due to unidentified influenza virus with other respiratory manifestations: Secondary | ICD-10-CM

## 2018-06-09 LAB — INFLUENZA PANEL BY PCR (TYPE A & B)
INFLBPCR: NEGATIVE
Influenza A By PCR: NEGATIVE

## 2018-06-09 MED ORDER — ACETAMINOPHEN 325 MG PO TABS
ORAL_TABLET | ORAL | Status: AC
  Filled 2018-06-09: qty 3

## 2018-06-09 MED ORDER — ACETAMINOPHEN 325 MG PO TABS
975.0000 mg | ORAL_TABLET | Freq: Once | ORAL | Status: AC
Start: 2018-06-09 — End: 2018-06-09
  Administered 2018-06-09: 975 mg via ORAL

## 2018-06-09 NOTE — Discharge Instructions (Signed)
You may take Dayquil or Nyquil for symptoms We will call you when the chest xray report and flu test are back to tell you how we are going to treat. If both are negative, then you need to follow the Corona virus protocol.

## 2018-06-09 NOTE — ED Triage Notes (Addendum)
Patient reports having a uri a month ago.  Patient did feel like he did get better.  However, symptoms are returning: cough, dry cough, patient has runny nose, slight throat congestion, slight sore throat, no ear pain  Patient needing work note

## 2018-06-09 NOTE — ED Provider Notes (Signed)
[facility]    CSN: [id number] Arrival date & time: 06/09/18  1521     History   Chief Complaint Chief Complaint  Patient presents with  . Cough    HPI Ricardo Pope is a 32 y.o. male.   Who present of recurrent cough, HA, rhinitis and mild aches since yesterday. Was completely well from illness 1 month ago but took 2 weeks to get well. He denies SOB, wheezing or recent travel     History reviewed. No pertinent past medical history.  There are no active problems to display for this patient.   History reviewed. No pertinent surgical history.   Home Medications    Prior to Admission medications   Not on File    Family History History reviewed. No pertinent family history.  Social History Social History   Tobacco Use  . Smoking status: Current Every Day Smoker    Packs/day: 0.25    Types: Cigarettes  . Smokeless tobacco: Never Used  Substance Use Topics  . Alcohol use: Yes  . Drug use: Never     Allergies   Patient has no known allergies.   Review of Systems Review of Systems  Constitutional: Positive for chills. Negative for appetite change, diaphoresis and fever.  HENT: Positive for congestion, postnasal drip and rhinorrhea. Negative for ear discharge, ear pain and sore throat.   Respiratory: Positive for cough. Negative for chest tightness, shortness of breath and wheezing.   Cardiovascular: Negative for chest pain.  Gastrointestinal: Negative for abdominal pain, diarrhea, nausea and vomiting.  Musculoskeletal: Positive for myalgias. Negative for neck pain and neck stiffness.  Skin: Negative for rash.  Neurological: Positive for headaches.  Hematological: Negative for adenopathy.   Physical Exam Triage Vital Signs ED Triage Vitals  Enc Vitals Group     BP 06/09/18 1550 (!) 147/95     Pulse Rate 06/09/18 1550 (!) 116     Resp 06/09/18 1550 18     Temp 06/09/18 1550 99.3 F (37.4 C)     Temp Source 06/09/18 1550 Oral     SpO2  06/09/18 1550 99 %     Weight --      Height --      Head Circumference --      Peak Flow --      Pain Score 06/09/18 1545 3     Pain Loc --      Pain Edu? --      Excl. in GC? --    No data found.  Updated Vital Signs BP (!) 147/95 (BP Location: Left Arm)   Pulse (!) 116   Temp 99.3 F (37.4 C) (Oral)   Resp 18   SpO2 99%   Visual Acuity Right Eye Distance:   Left Eye Distance:   Bilateral Distance:    Right Eye Near:   Left Eye Near:    Bilateral Near:     Physical Exam Alert, well kept in NAD, looks ill, but not toxic. He did not realize he had a fever today  EYES- non icterus, conjunctiva's are normal.  ENT- external ears and nose are normal, TM's gray and shiny, Nose with clear mucous and            Mild congestion bilaterally. Throat- clear. No sinus tenderness.  NECK- supple with no nodes LUNG- clear, with no wheezing, rhonchi or rales.  HEART- RRR with no murmurs SKIN- non jaundiced, no rashes or ecchymosis.    UC Treatments / Results  Labs (all labs ordered are listed, but only abnormal results are displayed) Labs Reviewed  INFLUENZA PANEL BY PCR (TYPE A & B)  negative Flu A&B  EKG None  Radiology Dg Chest 2 View  Result Date: 06/09/2018 CLINICAL DATA:  Cough, recurrent EXAM: CHEST - 2 VIEW COMPARISON:  07/11/2017 FINDINGS: The heart size and mediastinal contours are within normal limits. Both lungs are clear. The visualized skeletal structures are unremarkable. IMPRESSION: No acute abnormality of the lungs.  No focal airspace opacity. Electronically Signed   By: Lauralyn Primes M.D.   On: 06/09/2018 16:36    Procedures Procedures  Medications Ordered in UC Medications  acetaminophen (TYLENOL) tablet 975 mg (975 mg Oral Given 06/09/18 1659)    Initial Impression / Assessment and Plan / UC Course  I have reviewed the triage vital signs and the nursing notes. I sent out a flu test and we will inform him of results when back. Final CXR is pending.  Was given Corona virus protocol in witting he needs to follow.  If he gets worse, needs to go to the hospital.   Final Clinical Impressions(s) / UC Diagnoses   Final diagnoses:  Influenza-like illness  Cough     Discharge Instructions     You may take Dayquil or Nyquil for symptoms We will call you when the chest xray report and flu test are back to tell you how we are going to treat. If both are negative, then you need to follow the Corona virus protocol.    8:39 pm- I called pt with his results, needs to follow Corona Virus protocol as noted on the paper work I gave him.  ED Prescriptions    None     Controlled Substance Prescriptions [place] Controlled Substance Registry consulted?    Garey Ham, Cordelia Poche 06/09/18 2040

## 2018-08-12 IMAGING — US US ABDOMEN COMPLETE
1 series · 14 of 25 positions shown · non-contrast
Comparison: None.

CLINICAL DATA: Right upper quadrant pain for 1 day.

EXAM:
ABDOMEN ULTRASOUND COMPLETE

[Series 1: us abdomen complete · 0.19mm/px · 14 of 73 slices shown]
[im 1/73]
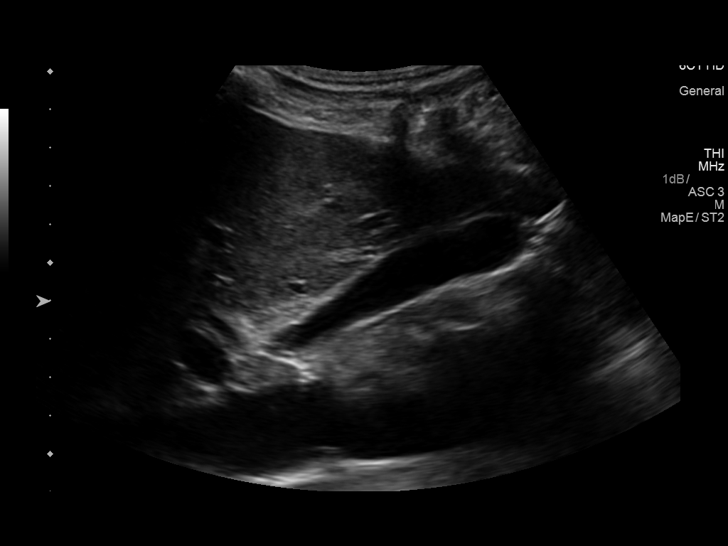
[im 7/73]
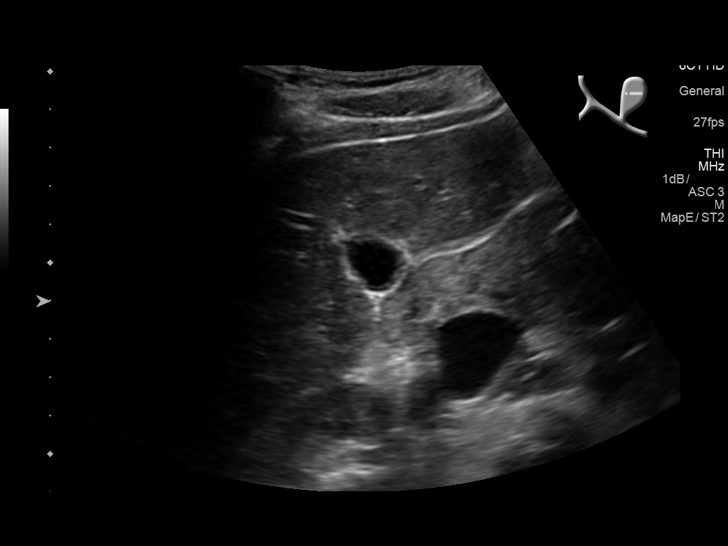
[im 13/73]
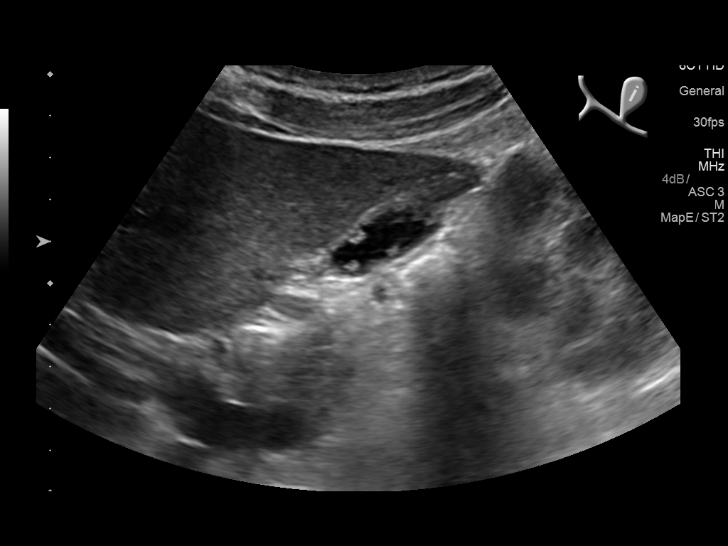
[im 19/73]
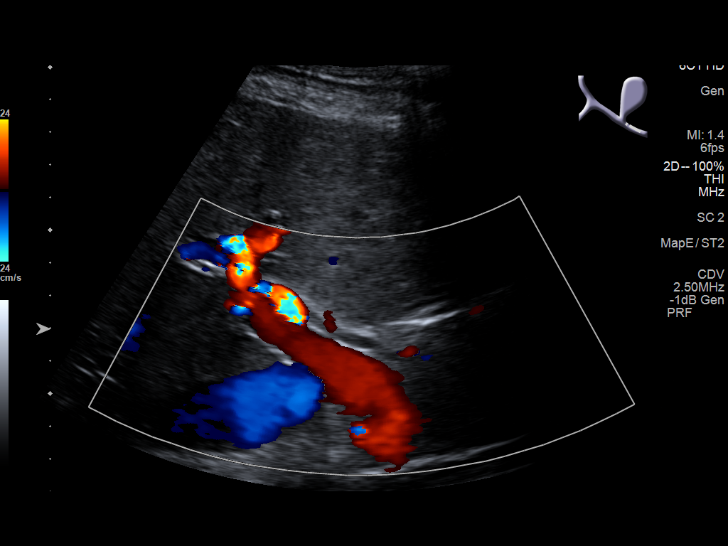
[im 25/73]
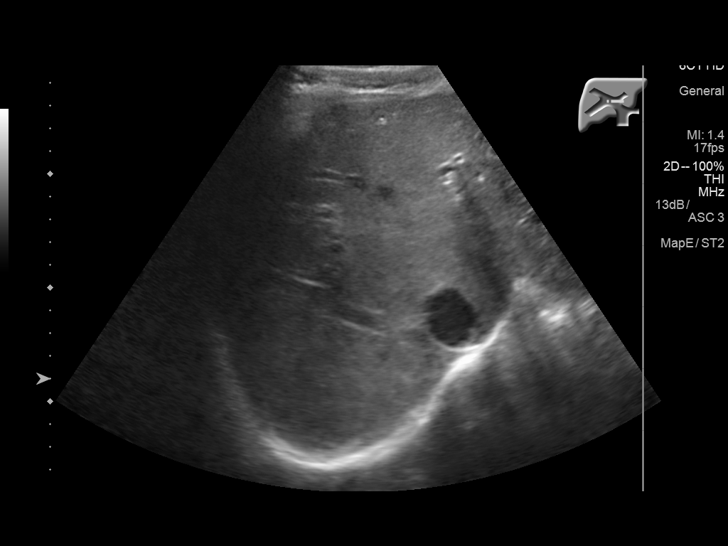
[im 28/73]
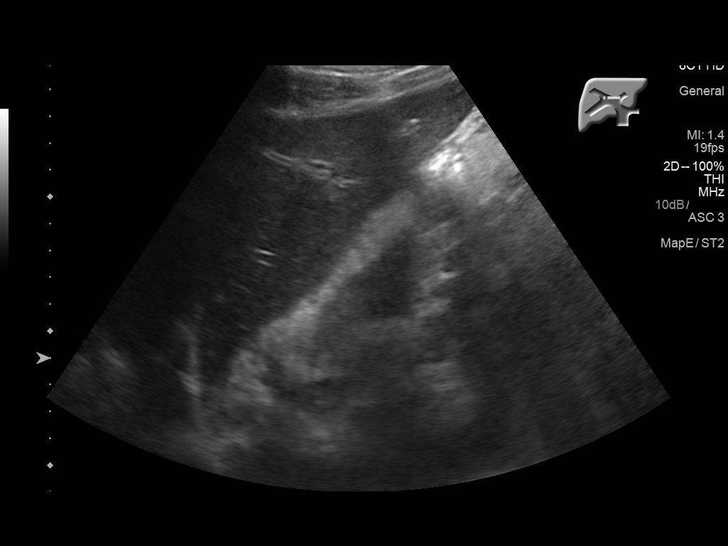
[im 34/73]
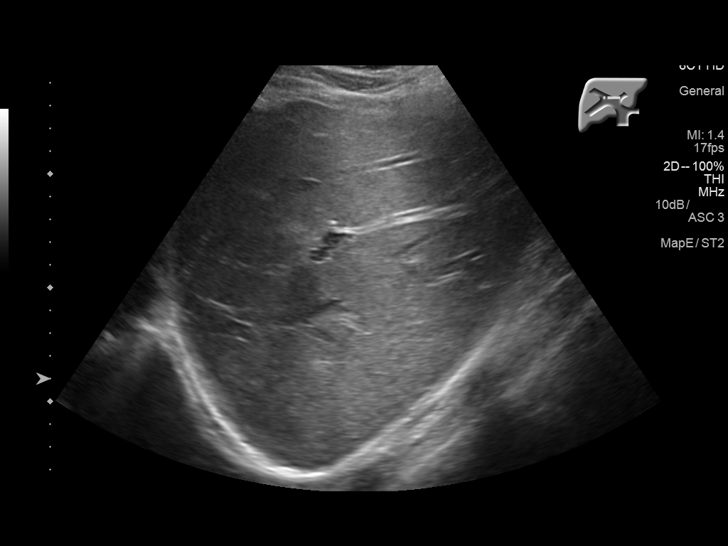
[im 40/73]
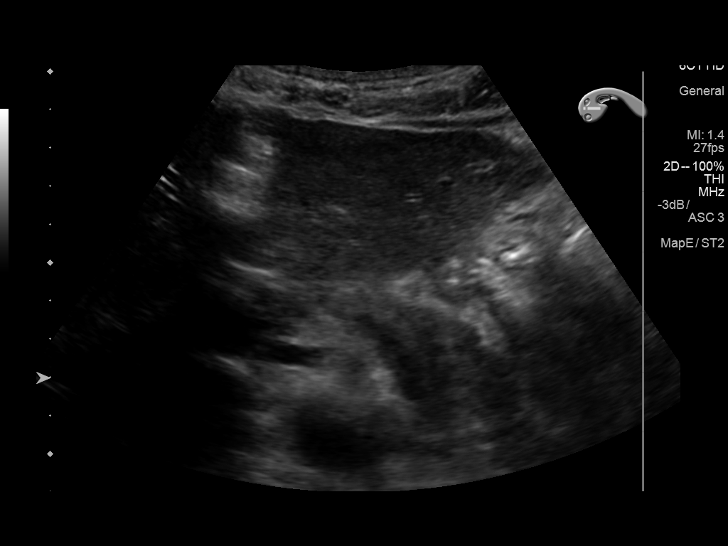
[im 46/73]
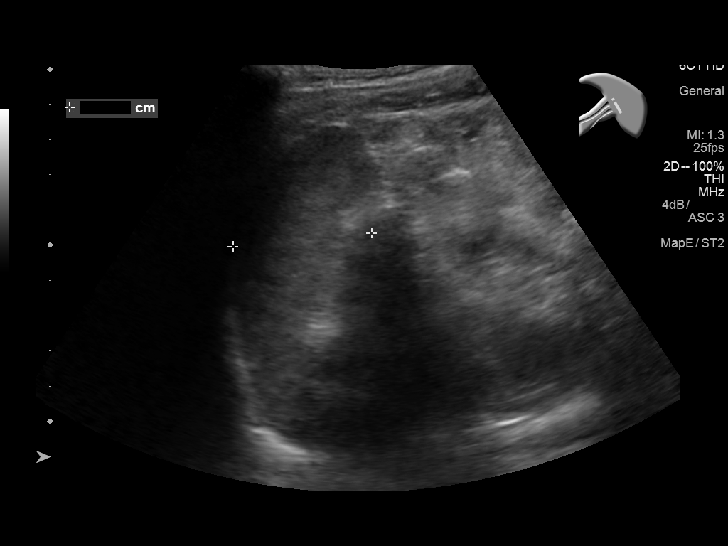
[im 49/73]
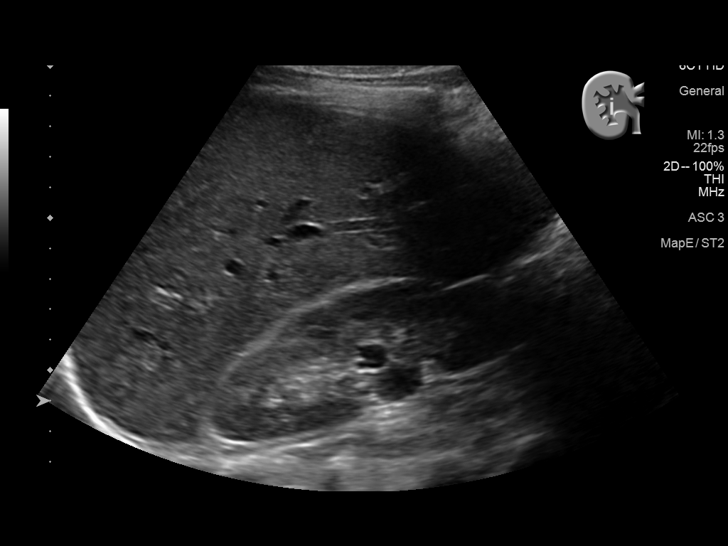
[im 55/73]
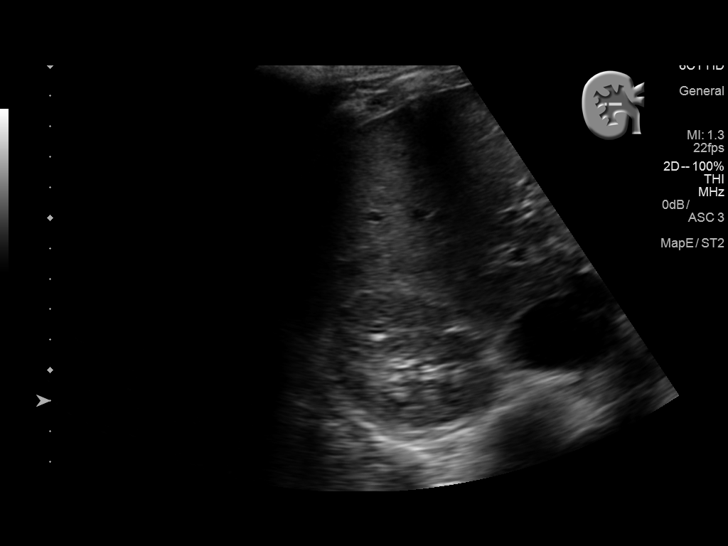
[im 61/73]
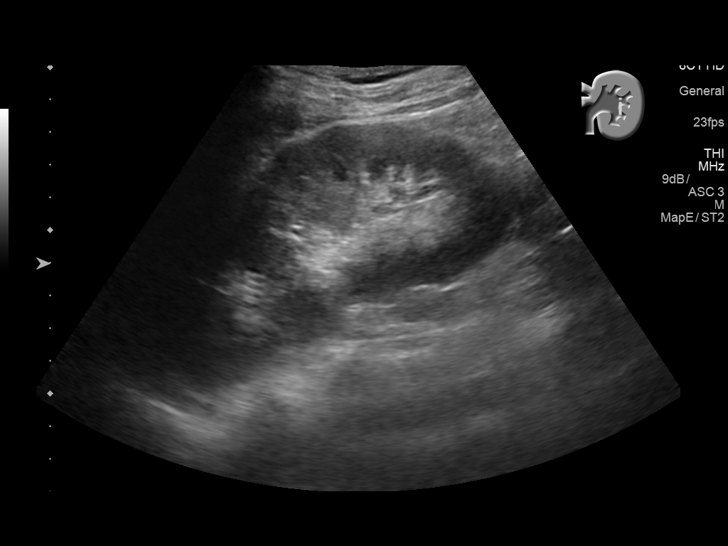
[im 67/73]
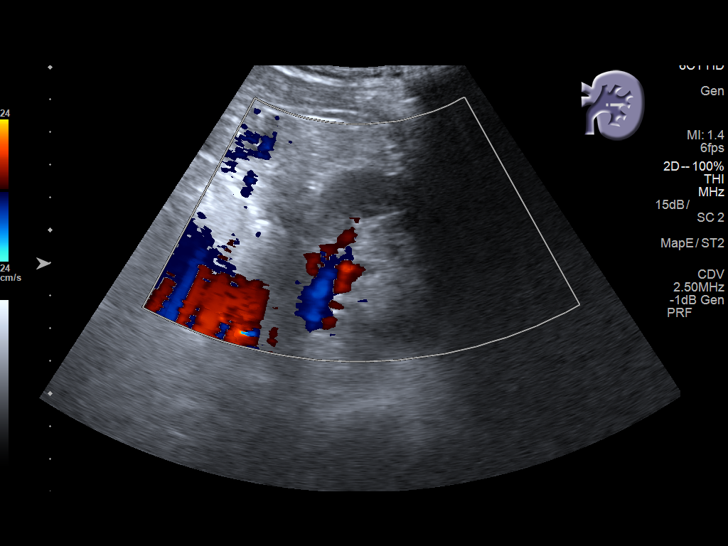
[im 73/73]
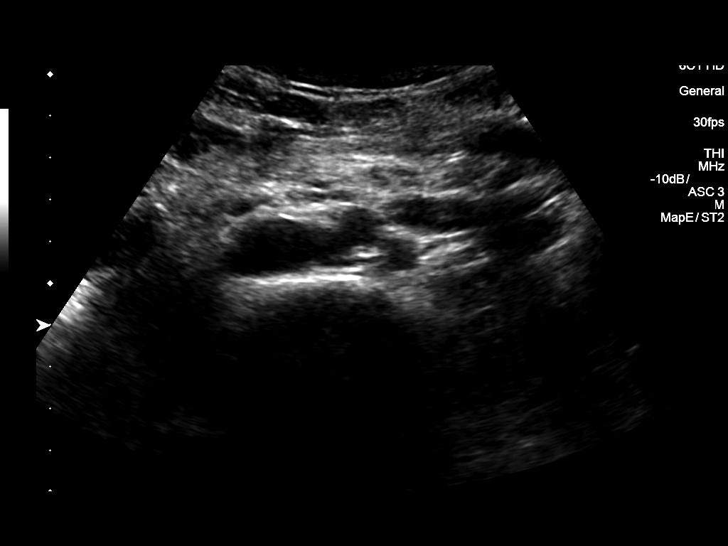

[14 of 25 positions shown; findings below may reference images not displayed]

FINDINGS: Gallbladder: Multiple small gallbladder polyps, largest measuring
about 4 mm diameter. Based on size criteria, these are likely
benign. No gallstones or wall thickening. Murphy's sign is negative.

Common bile duct: Diameter: 4.3 mm, normal

Liver: No focal lesion identified. Within normal limits in
parenchymal echogenicity. Portal vein is patent on color Doppler
imaging with normal direction of blood flow towards the liver.

IVC: No abnormality visualized.

Pancreas: Visualized portion unremarkable.

Spleen: Size and appearance within normal limits.

Right Kidney: Length: 11.3 cm. Echogenicity within normal limits. No
mass or hydronephrosis visualized.

Left Kidney: Length: 11.8 cm. Echogenicity within normal limits. No
mass or hydronephrosis visualized.

Abdominal aorta: No aneurysm visualized.

Other findings: None.
IMPRESSION: 1. Multiple gallbladder polyps measuring up to 4 mm diameter, likely
benign. Multiple polyps can be seen with adenomyomatosis.
2. No evidence of cholelithiasis or cholecystitis.

## 2018-08-23 ENCOUNTER — Ambulatory Visit (HOSPITAL_COMMUNITY)
Admission: EM | Admit: 2018-08-23 | Discharge: 2018-08-23 | Disposition: A | Discharge status: Home / Self Care | Payer: BC Managed Care – PPO | Attending: Family Medicine | Admitting: Family Medicine

## 2018-08-23 ENCOUNTER — Other Ambulatory Visit: Payer: Self-pay

## 2018-08-23 ENCOUNTER — Encounter (HOSPITAL_COMMUNITY): Payer: Self-pay

## 2018-08-23 DIAGNOSIS — R509 Fever, unspecified: Secondary | ICD-10-CM | POA: Diagnosis not present

## 2018-08-23 DIAGNOSIS — J029 Acute pharyngitis, unspecified: Secondary | ICD-10-CM

## 2018-08-23 LAB — POCT RAPID STREP A: Streptococcus, Group A Screen (Direct): POSITIVE — AB

## 2018-08-23 MED ORDER — AMOXICILLIN 875 MG PO TABS: 875.0000 mg | ORAL_TABLET | Freq: Two times a day (BID) | ORAL | 0 refills | Status: AC

## 2018-08-23 NOTE — ED Provider Notes (Addendum)
[facility]   [id number] 08/23/18 Arrival Time: 1128  ASSESSMENT & PLAN:  1. Fever, unspecified fever cause   2. Sore throat    Rapid strep: POSITIVE. No signs of peritonsillar abscess. Discussed.  Meds ordered this encounter  Medications  . amoxicillin (AMOXIL) 875 MG tablet    Sig: Take 1 tablet (875 mg total) by mouth 2 (two) times daily for 10 days.    Dispense:  20 tablet    Refill:  0   See AVS for d/c instructions. OTC analgesics and throat care as needed  Instructed to finish full 10 day course of antibiotics. Will follow up if not showing significant improvement over the next 24-48 hours.  Reviewed expectations re: course of current medical issues. Questions answered. Outlined signs and symptoms indicating need for more acute intervention. Patient verbalized understanding. After Visit Summary given.   SUBJECTIVE:  Ricardo Pope is a 33 y.o. male who reports a sore throat. Describes as "very painful when I swallow". Onset gradual beginning 1-2 days ago. No respiratory symptoms. Overall normal PO intake but reports discomfort with swallowing. No specific alleviating factors. Fever reported: subjective. No neck pain or swelling. No associated n/v/abdominal symptoms. Sick contacts: none known.  OTC treatment: Tylenol with some help.  ROS: As per HPI. All other systems negative.   OBJECTIVE:  Vitals:   08/23/18 1214 08/23/18 1220  BP: 137/87   Pulse: (!) 113   Resp: 18   Temp: (!) 100.4 F (38 C)   SpO2: 100%   Weight:  63.5 kg     General appearance: alert; no distress HEENT: throat with tonsillar hypertrophy, moderate erythema and exudates present; uvula midline: yes Neck: supple with FROM; small bilateral LAD with tenderness CV: mild tachycardia; regular Lungs: clear to auscultation bilaterally Abd: soft; non-tender Skin: reveals no rash; warm and dry Psychological: alert and cooperative; normal mood and affect  No Known Allergies  No  h/o recurrent strep throat.  Social History   Socioeconomic History  . Marital status: Single    Spouse name: Not on file  . Number of children: Not on file  . Years of education: Not on file  . Highest education level: Not on file  Occupational History  . Not on file  Social Needs  . Financial resource strain: Not on file  . Food insecurity:    Worry: Not on file    Inability: Not on file  . Transportation needs:    Medical: Not on file    Non-medical: Not on file  Tobacco Use  . Smoking status: Current Every Day Smoker    Packs/day: 0.25    Types: Cigarettes  . Smokeless tobacco: Never Used  Substance and Sexual Activity  . Alcohol use: Yes  . Drug use: Never  . Sexual activity: Not on file  Lifestyle  . Physical activity:    Days per week: Not on file    Minutes per session: Not on file  . Stress: Not on file  Relationships  . Social connections:    Talks on phone: Not on file    Gets together: Not on file    Attends religious service: Not on file    Active member of club or organization: Not on file    Attends meetings of clubs or organizations: Not on file    Relationship status: Not on file  . Intimate partner violence:    Fear of current or ex partner: Not on file    Emotionally abused:  Not on file    Physically abused: Not on file    Forced sexual activity: Not on file  Other Topics Concern  . Not on file  Social History Narrative  . Not on file   History reviewed. No pertinent family history.        Vanessa Kick, MD 08/23/18 1242    Vanessa Kick, MD 08/23/18 [phone]

## 2018-08-23 NOTE — Discharge Instructions (Signed)
You may use over the counter ibuprofen or acetaminophen as needed.  °For a sore throat, over the counter products such as [employer] Peroxyl Mouth Sore Rinse or Chloraseptic Sore Throat Spray may provide some temporary relief. ° ° ° ° °

## 2018-08-23 NOTE — ED Triage Notes (Signed)
Pt states he has swollen lymph nodes x 2 days. Pt has pain on the left side of his neck.

## 2019-05-06 IMAGING — CR DG CHEST 2V
2 series · 2 of 2 positions shown · non-contrast
Comparison: None.

CLINICAL DATA: Right upper quadrant pain, nausea, and fever for
several days.

EXAM:
CHEST - 2 VIEW

[w chest pa]
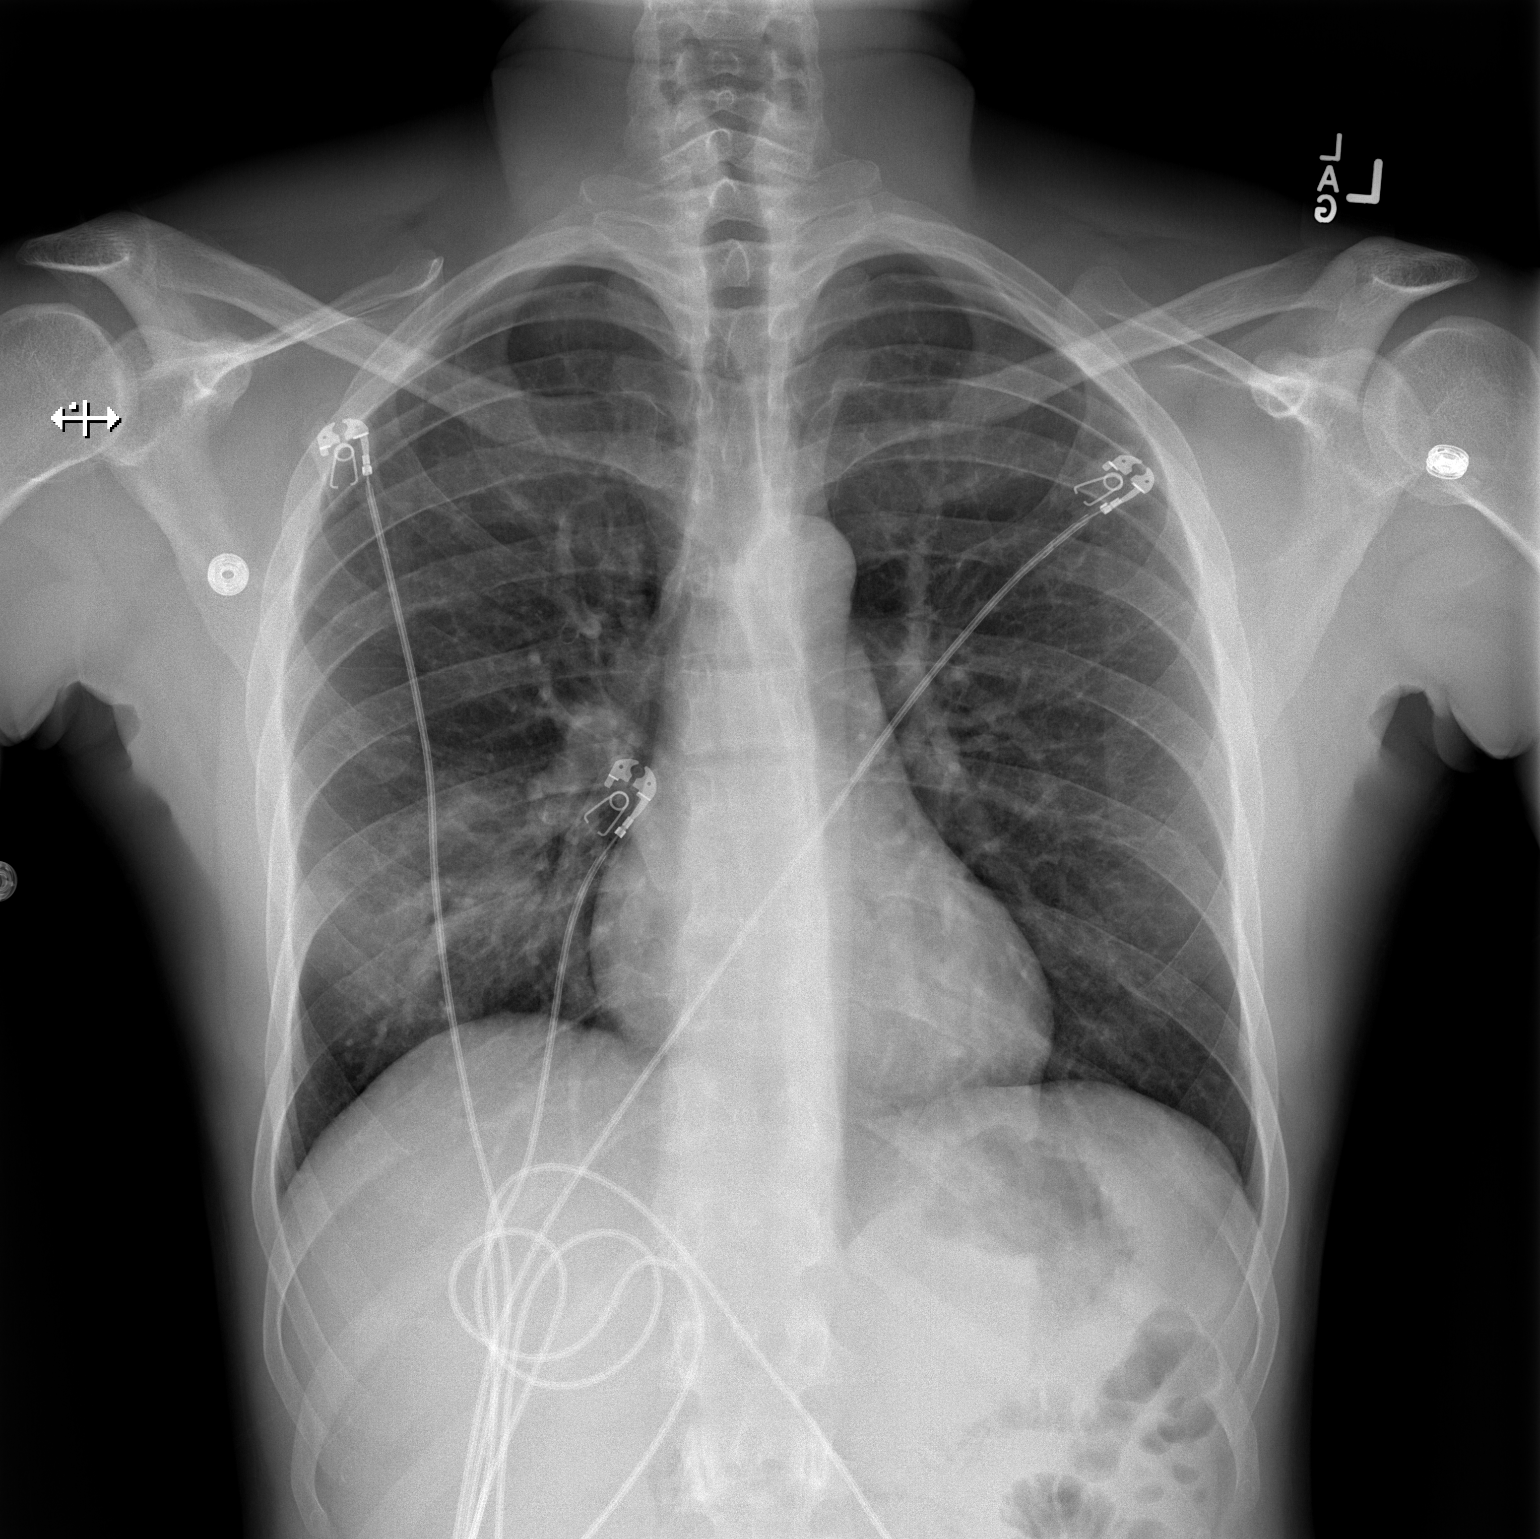

[w chest lat]
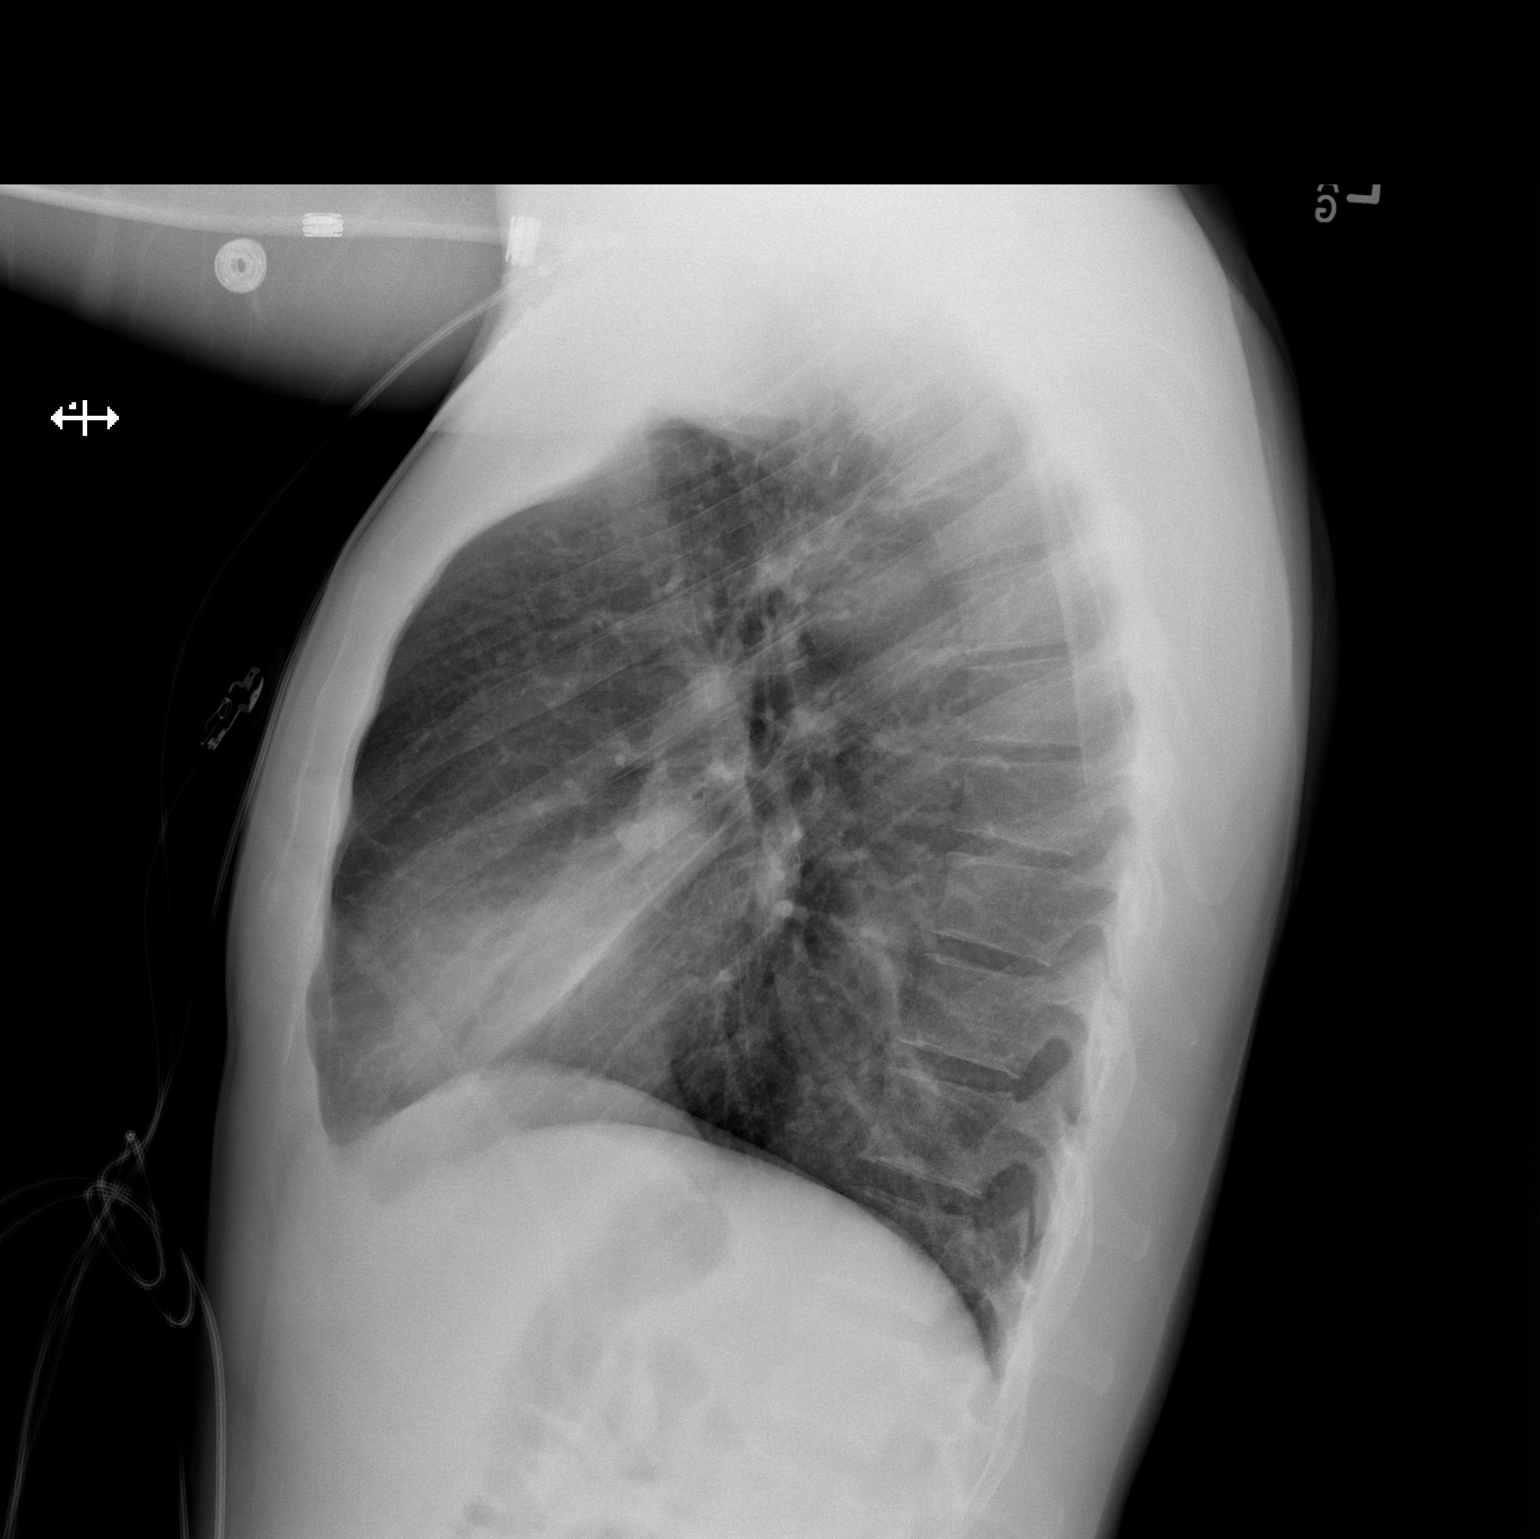

[2 of 2 positions shown; findings below may reference images not displayed]

FINDINGS: Consolidation in the right middle lung consistent with pneumonia.
Heart size and pulmonary vascularity are normal. Left lung is clear.
No blunting of costophrenic angles. No pneumothorax. Mediastinal
contours appear intact.
IMPRESSION: Consolidation in the right middle lung likely represents pneumonia.

## 2019-07-06 ENCOUNTER — Encounter (HOSPITAL_COMMUNITY): Payer: Self-pay

## 2019-07-06 ENCOUNTER — Emergency Department (HOSPITAL_COMMUNITY)
Admission: EM | Admit: 2019-07-06 | Discharge: 2019-07-07 | Disposition: A | Discharge status: Home / Self Care | Payer: BC Managed Care – PPO | Attending: Emergency Medicine | Admitting: Emergency Medicine

## 2019-07-06 ENCOUNTER — Other Ambulatory Visit: Payer: Self-pay

## 2019-07-06 DIAGNOSIS — R519 Headache, unspecified: Secondary | ICD-10-CM | POA: Insufficient documentation

## 2019-07-06 DIAGNOSIS — R21 Rash and other nonspecific skin eruption: Secondary | ICD-10-CM | POA: Insufficient documentation

## 2019-07-06 DIAGNOSIS — Z5321 Procedure and treatment not carried out due to patient leaving prior to being seen by health care provider: Secondary | ICD-10-CM | POA: Insufficient documentation

## 2019-07-06 NOTE — ED Triage Notes (Signed)
Patient c/o migraine x 3 days. Patient states he went to an UC, but has not had any relief from OTC meds. Patient states sensitivity to light  Patient also c/o rash on the right side of his eye, forehead area x 3 days.

## 2020-03-15 ENCOUNTER — Ambulatory Visit (HOSPITAL_COMMUNITY)
Admission: EM | Admit: 2020-03-15 | Discharge: 2020-03-15 | Disposition: A | Discharge status: Home / Self Care | Payer: HRSA Program | Attending: Family Medicine | Admitting: Family Medicine

## 2020-03-15 ENCOUNTER — Encounter (HOSPITAL_COMMUNITY): Payer: Self-pay | Admitting: Emergency Medicine

## 2020-03-15 ENCOUNTER — Other Ambulatory Visit: Payer: Self-pay

## 2020-03-15 DIAGNOSIS — R059 Cough, unspecified: Secondary | ICD-10-CM | POA: Diagnosis present

## 2020-03-15 DIAGNOSIS — U071 COVID-19: Secondary | ICD-10-CM | POA: Diagnosis not present

## 2020-03-15 DIAGNOSIS — F17210 Nicotine dependence, cigarettes, uncomplicated: Secondary | ICD-10-CM | POA: Diagnosis not present

## 2020-03-15 DIAGNOSIS — R509 Fever, unspecified: Secondary | ICD-10-CM

## 2020-03-15 DIAGNOSIS — J069 Acute upper respiratory infection, unspecified: Secondary | ICD-10-CM

## 2020-03-15 MED ORDER — IBUPROFEN 800 MG PO TABS: 800.0000 mg | ORAL_TABLET | Freq: Three times a day (TID) | ORAL | 0 refills | Status: DC

## 2020-03-15 NOTE — ED Triage Notes (Addendum)
Patient c/o bilateral lower back pain x 2 days.  Patient c/o cough x 1 day.   Patient denies trauma or fall.   Patient endorses that onset of symptoms began after getting pool into swim.   Patient endorses pain is radiating down legs.   Patient endorses weakness and chills.

## 2020-03-15 NOTE — Discharge Instructions (Signed)
You have been tested for COVID-19 today. °If your test returns positive, you will receive a phone call from [facility] regarding your results. °Negative test results are not called. °Both positive and negative results area always visible on MyChart. °If you do not have a MyChart account, sign up instructions are provided in your discharge papers. °Please do not hesitate to contact us should you have questions or concerns. ° °

## 2020-03-16 LAB — SARS CORONAVIRUS 2 (TAT 6-24 HRS): SARS Coronavirus 2: POSITIVE — AB

## 2020-03-16 NOTE — ED Provider Notes (Signed)
  [facility]   [id number] 03/15/20 Arrival Time: 1627  ASSESSMENT & PLAN:  1. Acute upper respiratory infection   2. Subjective fever      COVID-19 testing sent. See letter/work note on file for self-isolation guidelines. OTC symptom care as needed.  Meds ordered this encounter  Medications  . ibuprofen (ADVIL) 800 MG tablet    Sig: Take 1 tablet (800 mg total) by mouth 3 (three) times daily with meals.    Dispense:  21 tablet    Refill:  0      Reviewed expectations re: course of current medical issues. Questions answered. Outlined signs and symptoms indicating need for more acute intervention. Understanding verbalized. After Visit Summary given.   SUBJECTIVE: History from: patient. Ricardo Pope is a 34 y.o. male who presents with worries regarding COVID-19. Known COVID-19 contact: family member. Recent travel: none. Reports: low back ache and cough; 1 day. Denies: fever and difficulty breathing. Normal PO intake without n/v/d.    OBJECTIVE:  Vitals:   03/15/20 1922 03/15/20 1924  BP:  (!) 141/79  Pulse:  (!) 120  Resp:  19  Temp:  100 F (37.8 C)  TempSrc:  Oral  SpO2:  97%  Weight: 63.5 kg   Height: 5\' 9"  (1.753 m)     General appearance: alert; no distress Eyes: PERRLA; EOMI; conjunctiva normal HENT: [place]; AT; with nasal congestion Neck: supple  Lungs: speaks full sentences without difficulty; unlabored; dry cough Extremities: no edema Skin: warm and dry Neurologic: normal gait Psychological: alert and cooperative; normal mood and affect    No Known Allergies  History reviewed. No pertinent past medical history. Social History   Socioeconomic History  . Marital status: Single    Spouse name: Not on file  . Number of children: Not on file  . Years of education: Not on file  . Highest education level: Not on file  Occupational History  . Not on file  Tobacco Use  . Smoking status: Current Every Day Smoker    Packs/day: 0.25     Types: Cigarettes  . Smokeless tobacco: Never Used  Vaping Use  . Vaping Use: Never used  Substance and Sexual Activity  . Alcohol use: Yes  . Drug use: Never  . Sexual activity: Not on file  Other Topics Concern  . Not on file  Social History Narrative  . Not on file   Social Determinants of Health   Financial Resource Strain: Not on file  Food Insecurity: Not on file  Transportation Needs: Not on file  Physical Activity: Not on file  Stress: Not on file  Social Connections: Not on file  Intimate Partner Violence: Not on file   History reviewed. No pertinent family history. History reviewed. No pertinent surgical history.   , MD 03/16/20 [phone]

## 2020-04-03 IMAGING — DX CHEST - 2 VIEW
2 series · 2 of 2 positions shown · non-contrast
Comparison: 07/11/2017

CLINICAL DATA: Cough, recurrent

EXAM:
CHEST - 2 VIEW

[chest pa]
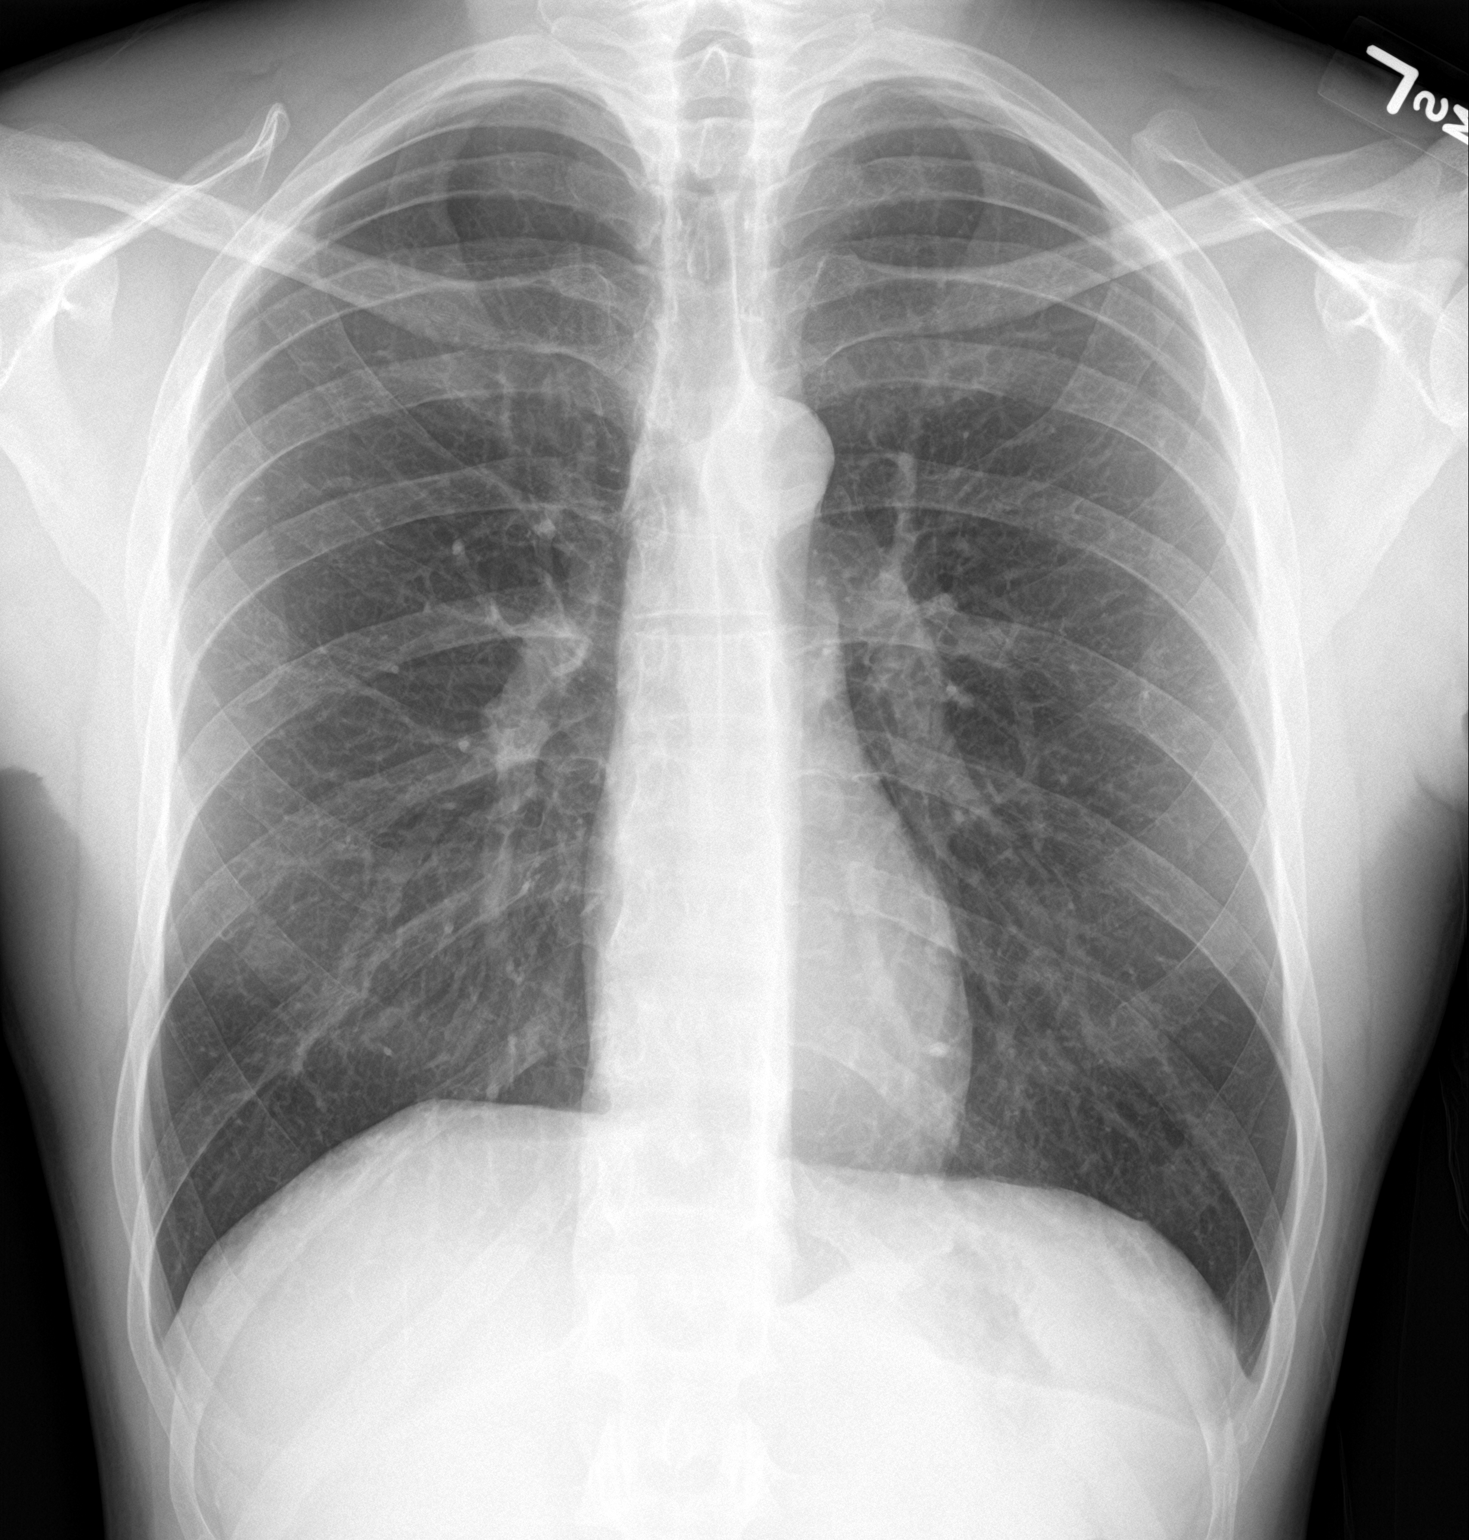

[chest lat]
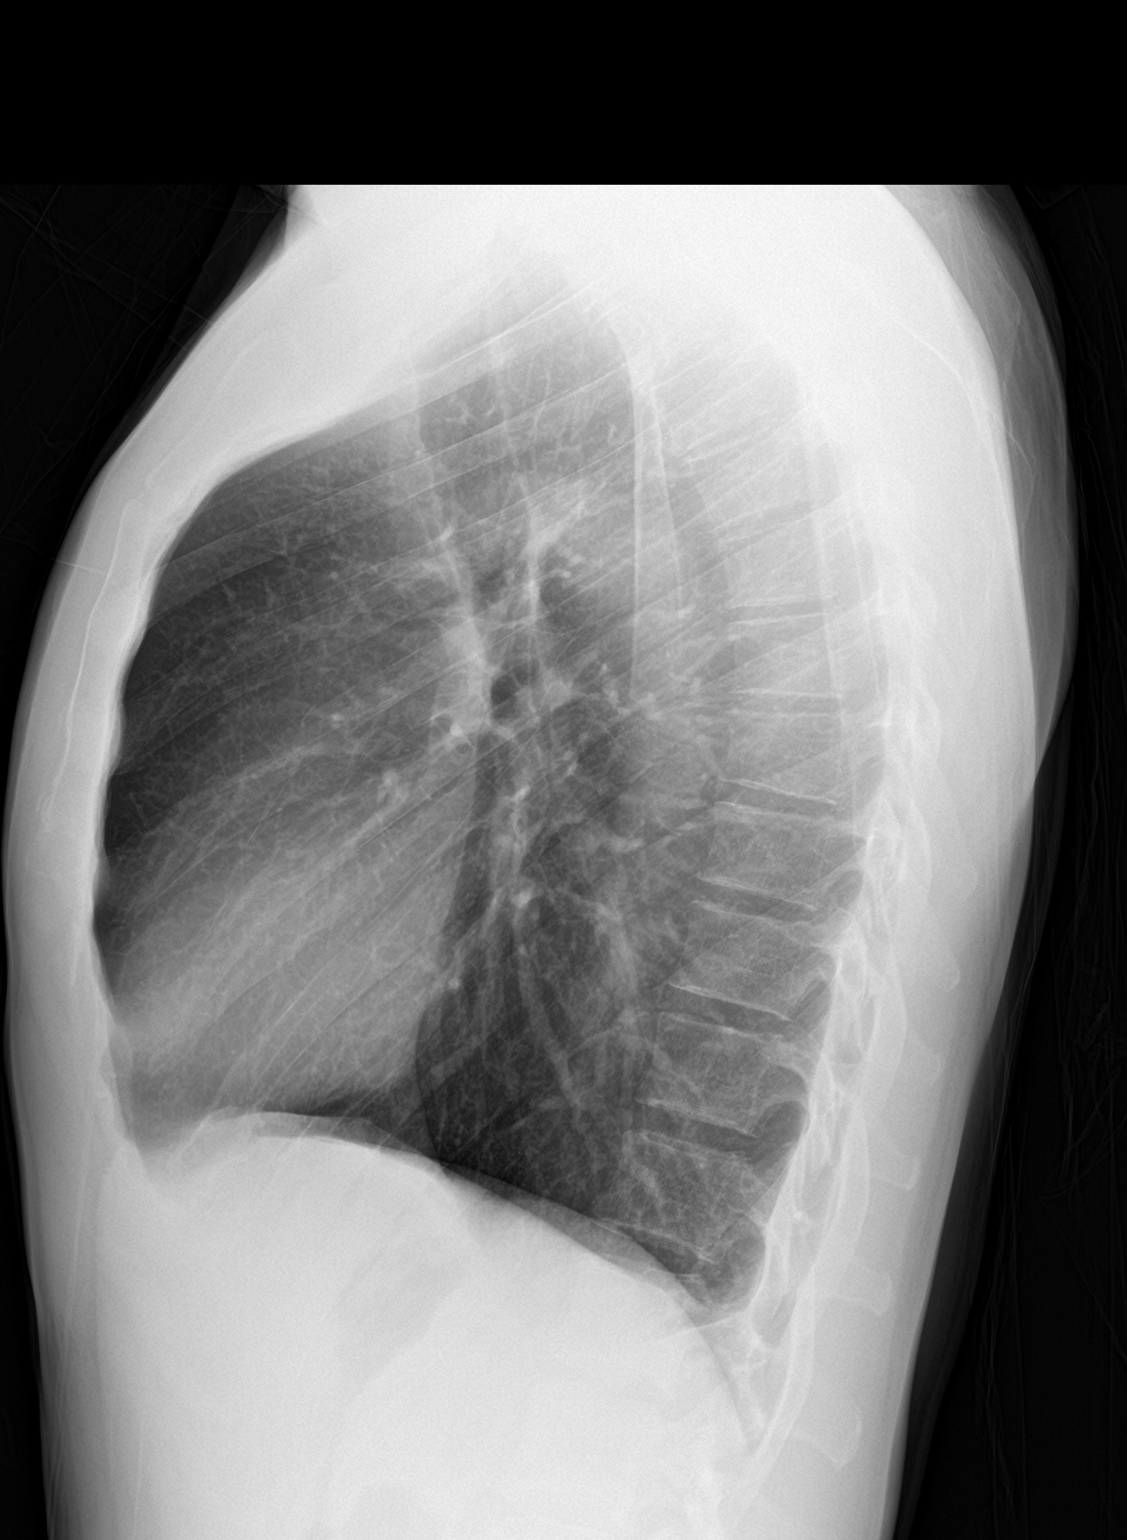

[2 of 2 positions shown; findings below may reference images not displayed]

FINDINGS: The heart size and mediastinal contours are within normal limits.
Both lungs are clear. The visualized skeletal structures are
unremarkable.
IMPRESSION: No acute abnormality of the lungs.  No focal airspace opacity.

## 2020-08-28 ENCOUNTER — Other Ambulatory Visit: Payer: Self-pay

## 2020-08-28 ENCOUNTER — Encounter (HOSPITAL_BASED_OUTPATIENT_CLINIC_OR_DEPARTMENT_OTHER): Payer: Self-pay | Admitting: Obstetrics and Gynecology

## 2020-08-28 ENCOUNTER — Emergency Department (HOSPITAL_BASED_OUTPATIENT_CLINIC_OR_DEPARTMENT_OTHER): Payer: PRIVATE HEALTH INSURANCE

## 2020-08-28 ENCOUNTER — Emergency Department (HOSPITAL_BASED_OUTPATIENT_CLINIC_OR_DEPARTMENT_OTHER)
Admission: EM | Admit: 2020-08-28 | Discharge: 2020-08-28 | Disposition: A | Discharge status: Home / Self Care | Payer: PRIVATE HEALTH INSURANCE | Attending: Emergency Medicine | Admitting: Emergency Medicine

## 2020-08-28 DIAGNOSIS — W272XXA Contact with scissors, initial encounter: Secondary | ICD-10-CM | POA: Insufficient documentation

## 2020-08-28 DIAGNOSIS — F17210 Nicotine dependence, cigarettes, uncomplicated: Secondary | ICD-10-CM | POA: Insufficient documentation

## 2020-08-28 DIAGNOSIS — S63501A Unspecified sprain of right wrist, initial encounter: Secondary | ICD-10-CM

## 2020-08-28 DIAGNOSIS — Y990 Civilian activity done for income or pay: Secondary | ICD-10-CM | POA: Insufficient documentation

## 2020-08-28 DIAGNOSIS — R030 Elevated blood-pressure reading, without diagnosis of hypertension: Secondary | ICD-10-CM

## 2020-08-28 MED ORDER — IBUPROFEN 400 MG PO TABS
400.0000 mg | ORAL_TABLET | Freq: Once | ORAL | Status: AC
  Administered 2020-08-28: 400 mg via ORAL
  Filled 2020-08-28: qty 1

## 2020-08-28 NOTE — ED Triage Notes (Signed)
Patient reports right sided hand pain. Patient reports he was using scissors at work to cut a large plastic piece and had an injury.

## 2020-08-28 NOTE — Discharge Instructions (Signed)
It was our pleasure to provide your ER care today - we hope that you feel better.  Your xrays show no fracture. As we discussed, you may have sprained the ligaments/soft tissues in the wrist.   You may wear splint for comfort/support for the next week. Take acetaminophen or ibuprofen as need for pain.   Follow up with your primary care doctor in the next couple weeks.  Also have your blood pressure rechecked then as it is high today.   Also follow up with hand/wrist specialist - call office tomorrow to arrange follow up appointment.

## 2020-08-28 NOTE — ED Provider Notes (Signed)
[facility] EMERGENCY DEPT Provider Note   CSN: [id number] Arrival date & time: 08/28/20  1625     History Chief Complaint  Patient presents with   Wrist Pain    Ricardo Pope is a 35 y.o. male.  Pt c/o right wrist injury ~ 3 weeks ago at work. States was using heavy/industrial scissors, and wrist got twisted/strained with pain to right wrist area since. Symptoms acute onset, moderate, constant, persistent, dull, non radiating. No hand/finger numbness or weakness. Uses both hands. Skin remained intact, no wounds. Denies other pain or injury.   The history is provided by the patient.  Hand Pain      History reviewed. No pertinent past medical history.  There are no problems to display for this patient.   History reviewed. No pertinent surgical history.     History reviewed. No pertinent family history.  Social History   Tobacco Use   Smoking status: Every Day    Packs/day: 0.25    Years: 10.00    Pack years: 2.50    Types: Cigarettes   Smokeless tobacco: Never  Vaping Use   Vaping Use: Never used  Substance Use Topics   Alcohol use: Yes   Drug use: Never    Home Medications Prior to Admission medications   Medication Sig Start Date End Date Taking? Authorizing Provider  ibuprofen (ADVIL) 800 MG tablet Take 1 tablet (800 mg total) by mouth 3 (three) times daily with meals. 03/15/20  Yes Mardella Layman, MD    Allergies    Patient has no known allergies.  Review of Systems   Review of Systems  Constitutional:  Negative for fever.  Musculoskeletal:        Right wrist injury/pain.   Skin:  Negative for wound.  Neurological:  Negative for weakness and numbness.   Physical Exam Updated Vital Signs BP (!) 147/96 (BP Location: Right Arm)   Pulse 96   Temp 98.2 F (36.8 C) (Oral)   Resp 16   Ht 1.753 m (5\' 9" )   Wt 65.3 kg   SpO2 100%   BMI 21.27 kg/m   Physical Exam Vitals and nursing note reviewed.  Constitutional:       Appearance: Normal appearance. He is well-developed.  HENT:     Head: Atraumatic.     Nose: Nose normal.     Mouth/Throat:     Mouth: Mucous membranes are moist.  Eyes:     General: No scleral icterus.    Conjunctiva/sclera: Conjunctivae normal.  Neck:     Trachea: No tracheal deviation.  Cardiovascular:     Rate and Rhythm: Normal rate.     Pulses: Normal pulses.     Heart sounds: Normal heart sounds.  Pulmonary:     Effort: Pulmonary effort is normal. No accessory muscle usage.  Genitourinary:    Comments: No cva tenderness. Musculoskeletal:        General: No swelling.     Cervical back: Neck supple.     Comments: Tenderness right wrist diffusely. No focal scaphoid tenderness. Radial pulse 2+, normal cap refill in fingers. No gross sts noted. No skin changes, erythema or increased warmth.   Skin:    General: Skin is warm and dry.     Findings: No rash.  Neurological:     Mental Status: He is alert.     Comments: Alert, speech clear. Right hand motor/sens grossly intact.   Psychiatric:        Mood and Affect: Mood  normal.    ED Results / Procedures / Treatments   Labs (all labs ordered are listed, but only abnormal results are displayed) Labs Reviewed - No data to display  EKG None  Radiology DG Wrist Complete Right  Result Date: 08/28/2020 CLINICAL DATA:  Right wrist pain following an injury. EXAM: RIGHT WRIST - COMPLETE 3+ VIEW COMPARISON:  None. FINDINGS: There is no evidence of fracture or dislocation. There is no evidence of arthropathy or other focal bone abnormality. Soft tissues are unremarkable. IMPRESSION: Normal examination. Electronically Signed   By: Beckie Salts M.D.   On: 08/28/2020 17:36    Procedures Procedures   Medications Ordered in ED Medications  ibuprofen (ADVIL) tablet 400 mg (400 mg Oral Given 08/28/20 1710)    ED Course  I have reviewed the triage vital signs and the nursing notes.  Pertinent labs & imaging results that were  available during my care of the patient were reviewed by me and considered in my medical decision making (see chart for details).    MDM Rules/Calculators/A&P                         Xrays.   Reviewed nursing notes and prior charts for additional history.   Xrays reviewed/interpreted by me - no fx.   Right wrist velcro/removable splint. Ibuprofen po.  Pt appears stable for d/c.    Final Clinical Impression(s) / ED Diagnoses Final diagnoses:  Sprain of right wrist, initial encounter  Elevated blood pressure reading    Rx / DC Orders ED Discharge Orders     None        Cathren Laine, MD 08/28/20 1746

## 2020-08-28 NOTE — ED Notes (Signed)
Right hand splint applied. Pt dc. Pt stated understanding of dc instructions.

## 2020-09-26 ENCOUNTER — Encounter (HOSPITAL_COMMUNITY): Payer: Self-pay

## 2020-09-26 ENCOUNTER — Ambulatory Visit (HOSPITAL_COMMUNITY)
Admission: EM | Admit: 2020-09-26 | Discharge: 2020-09-26 | Disposition: A | Discharge status: Home / Self Care | Payer: Self-pay

## 2020-09-26 ENCOUNTER — Other Ambulatory Visit: Payer: Self-pay

## 2020-09-26 DIAGNOSIS — S6391XA Sprain of unspecified part of right wrist and hand, initial encounter: Secondary | ICD-10-CM

## 2020-09-26 NOTE — ED Provider Notes (Signed)
[facility]    CSN: [id number] Arrival date & time: 09/26/20  1007      History   Chief Complaint Chief Complaint  Patient presents with   Hand Pain    HPI Ricardo Pope is a 35 y.o. male.   Patient presents to the urgent care for clearance for work for an injury that occurred at work after pushing up a window.  Patient states that he had right-sided second digit pain that radiated throughout hand at the time.  Patient reports that pain has resolved.  Denies any numbness or tingling.  Patient denies having take any over-the-counter pain relievers.   Hand Pain   History reviewed. No pertinent past medical history.  There are no problems to display for this patient.   History reviewed. No pertinent surgical history.     Home Medications    Prior to Admission medications   Medication Sig Start Date End Date Taking? Authorizing Provider  ibuprofen (ADVIL) 800 MG tablet Take 1 tablet (800 mg total) by mouth 3 (three) times daily with meals. 03/15/20   Mardella Layman, MD    Family History History reviewed. No pertinent family history.  Social History Social History   Tobacco Use   Smoking status: Every Day    Packs/day: 0.25    Years: 10.00    Pack years: 2.50    Types: Cigarettes   Smokeless tobacco: Never  Vaping Use   Vaping Use: Never used  Substance Use Topics   Alcohol use: Yes   Drug use: Never     Allergies   Patient has no known allergies.   Review of Systems Review of Systems Per HPI  Physical Exam Triage Vital Signs ED Triage Vitals  Enc Vitals Group     BP 09/26/20 1129 138/88     Pulse Rate 09/26/20 1128 (!) 101     Resp 09/26/20 1128 17     Temp 09/26/20 1128 98.3 F (36.8 C)     Temp Source 09/26/20 1128 Oral     SpO2 09/26/20 1128 97 %     Weight --      Height --      Head Circumference --      Peak Flow --      Pain Score 09/26/20 1126 0     Pain Loc --      Pain Edu? --      Excl. in GC? --    No data  found.  Updated Vital Signs BP 138/88   Pulse (!) 101   Temp 98.3 F (36.8 C) (Oral)   Resp 17   SpO2 97%   Visual Acuity Right Eye Distance:   Left Eye Distance:   Bilateral Distance:    Right Eye Near:   Left Eye Near:    Bilateral Near:     Physical Exam Constitutional:      Appearance: Normal appearance.  HENT:     Head: Normocephalic and atraumatic.  Eyes:     Extraocular Movements: Extraocular movements intact.     Conjunctiva/sclera: Conjunctivae normal.  Pulmonary:     Effort: Pulmonary effort is normal.  Musculoskeletal:     Right hand: Normal. No swelling, deformity, lacerations, tenderness or bony tenderness. Normal range of motion. Normal strength. Normal sensation. Normal capillary refill. Normal pulse.     Left hand: Normal.  Neurological:     General: No focal deficit present.     Mental Status: He is alert and oriented to person,  place, and time. Mental status is at baseline.  Psychiatric:        Mood and Affect: Mood normal.        Behavior: Behavior normal.        Thought Content: Thought content normal.        Judgment: Judgment normal.     UC Treatments / Results  Labs (all labs ordered are listed, but only abnormal results are displayed) Labs Reviewed - No data to display  EKG   Radiology No results found.  Procedures Procedures (including critical care time)  Medications Ordered in UC Medications - No data to display  Initial Impression / Assessment and Plan / UC Course  I have reviewed the triage vital signs and the nursing notes.  Pertinent labs & imaging results that were available during my care of the patient were reviewed by me and considered in my medical decision making (see chart for details).     Physical exam was normal for the right hand.  No visible abrasion, laceration, physical injury.  Patient appears to have healed well and denies pain at this time.  Patient was advised that this urgent care does not provide  clearance for Worker's Compensation.  Patient was advised to follow-up with employer for them to direct him to an occupational medicine facility to be cleared for work. Discussed strict return precautions. Patient verbalized understanding and is agreeable with plan.  Final Clinical Impressions(s) / UC Diagnoses   Final diagnoses:  Hand sprain, right, initial encounter     Discharge Instructions      We are unable to clear any type of Worker's Comp injuries.  Please follow-up with your employer and have them reach out to an occupational medicine practice for further evaluation, management, clearance.     ED Prescriptions   None    PDMP not reviewed this encounter.   Lance Muss, FNP 09/26/20 1204

## 2020-09-26 NOTE — ED Triage Notes (Signed)
Pt presents with pain on the right hand X 1 day.   States he needs to make sure he is good to return to work.

## 2020-09-26 NOTE — Discharge Instructions (Addendum)
We are unable to clear any type of Worker's Comp injuries.  Please follow-up with your employer and have them reach out to an occupational medicine practice for further evaluation, management, clearance.

## 2020-12-19 ENCOUNTER — Emergency Department (HOSPITAL_BASED_OUTPATIENT_CLINIC_OR_DEPARTMENT_OTHER): Payer: Self-pay

## 2020-12-19 ENCOUNTER — Emergency Department (HOSPITAL_BASED_OUTPATIENT_CLINIC_OR_DEPARTMENT_OTHER)
Admission: EM | Admit: 2020-12-19 | Discharge: 2020-12-19 | Disposition: A | Discharge status: Home / Self Care | Payer: Self-pay | Attending: Emergency Medicine | Admitting: Emergency Medicine

## 2020-12-19 ENCOUNTER — Other Ambulatory Visit: Payer: Self-pay

## 2020-12-19 ENCOUNTER — Encounter (HOSPITAL_BASED_OUTPATIENT_CLINIC_OR_DEPARTMENT_OTHER): Payer: Self-pay

## 2020-12-19 DIAGNOSIS — S0240FA Zygomatic fracture, left side, initial encounter for closed fracture: Secondary | ICD-10-CM | POA: Insufficient documentation

## 2020-12-19 DIAGNOSIS — S301XXA Contusion of abdominal wall, initial encounter: Secondary | ICD-10-CM | POA: Insufficient documentation

## 2020-12-19 DIAGNOSIS — F17210 Nicotine dependence, cigarettes, uncomplicated: Secondary | ICD-10-CM | POA: Insufficient documentation

## 2020-12-19 LAB — CBC WITH DIFFERENTIAL/PLATELET
Abs Immature Granulocytes: 0.02 10*3/uL (ref 0.00–0.07)
Basophils Absolute: 0 10*3/uL (ref 0.0–0.1)
Basophils Relative: 0 %
Eosinophils Absolute: 0.2 10*3/uL (ref 0.0–0.5)
Eosinophils Relative: 2 %
HCT: 43.2 % (ref 39.0–52.0)
Hemoglobin: 14.4 g/dL (ref 13.0–17.0)
Immature Granulocytes: 0 %
Lymphocytes Relative: 26 %
Lymphs Abs: 2 10*3/uL (ref 0.7–4.0)
MCH: 28.9 pg (ref 26.0–34.0)
MCHC: 33.3 g/dL (ref 30.0–36.0)
MCV: 86.7 fL (ref 80.0–100.0)
Monocytes Absolute: 1.1 10*3/uL — ABNORMAL HIGH (ref 0.1–1.0)
Monocytes Relative: 14 %
Neutro Abs: 4.4 10*3/uL (ref 1.7–7.7)
Neutrophils Relative %: 58 %
Platelets: 259 10*3/uL (ref 150–400)
RBC: 4.98 MIL/uL (ref 4.22–5.81)
RDW: 14 % (ref 11.5–15.5)
WBC: 7.7 10*3/uL (ref 4.0–10.5)
nRBC: 0 % (ref 0.0–0.2)

## 2020-12-19 LAB — URINALYSIS, ROUTINE W REFLEX MICROSCOPIC
Bilirubin Urine: NEGATIVE
Glucose, UA: NEGATIVE mg/dL
Hgb urine dipstick: NEGATIVE
Ketones, ur: NEGATIVE mg/dL
Leukocytes,Ua: NEGATIVE
Nitrite: NEGATIVE
Protein, ur: NEGATIVE mg/dL
Specific Gravity, Urine: <1.005 — ABNORMAL LOW (ref 1.005–1.030)
pH: 5 (ref 5.0–8.0)

## 2020-12-19 LAB — COMPREHENSIVE METABOLIC PANEL
ALT: 14 U/L (ref 0–44)
AST: 27 U/L (ref 15–41)
Albumin: 4.7 g/dL (ref 3.5–5.0)
Alkaline Phosphatase: 69 U/L (ref 38–126)
Anion gap: 12 (ref 5–15)
BUN: 5 mg/dL — ABNORMAL LOW (ref 6–20)
CO2: 25 mmol/L (ref 22–32)
Calcium: 9.3 mg/dL (ref 8.9–10.3)
Chloride: 98 mmol/L (ref 98–111)
Creatinine, Ser: 0.84 mg/dL (ref 0.61–1.24)
GFR, Estimated: >60 mL/min (ref >60)
Glucose, Bld: 81 mg/dL (ref 70–99)
Potassium: 4.1 mmol/L (ref 3.5–5.1)
Sodium: 135 mmol/L (ref 135–145)
Total Bilirubin: 0.8 mg/dL (ref 0.3–1.2)
Total Protein: 7.8 g/dL (ref 6.5–8.1)

## 2020-12-19 MED ORDER — DICLOFENAC SODIUM 50 MG PO TBEC: 50.0000 mg | DELAYED_RELEASE_TABLET | Freq: Two times a day (BID) | ORAL | 0 refills | Status: AC

## 2020-12-19 MED ORDER — METHOCARBAMOL 500 MG PO TABS: 500.0000 mg | ORAL_TABLET | Freq: Two times a day (BID) | ORAL | 0 refills | Status: DC
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
## 2020-12-19 NOTE — Discharge Instructions (Addendum)
Take Robaxin and diclofenac as needed as prescribed for pain.  You may continue to apply at the pain patch to your flank. See instructions for eating with fracture.  Follow-up with specialist, call to schedule appointment.  Apply ice to face for 20 to time.

## 2020-12-22 ENCOUNTER — Telehealth (HOSPITAL_BASED_OUTPATIENT_CLINIC_OR_DEPARTMENT_OTHER): Payer: Self-pay | Admitting: *Deleted

## 2021-02-19 ENCOUNTER — Other Ambulatory Visit: Payer: Self-pay

## 2021-02-19 ENCOUNTER — Encounter (HOSPITAL_BASED_OUTPATIENT_CLINIC_OR_DEPARTMENT_OTHER): Payer: Self-pay | Admitting: Emergency Medicine

## 2021-02-19 ENCOUNTER — Emergency Department (HOSPITAL_BASED_OUTPATIENT_CLINIC_OR_DEPARTMENT_OTHER)
Admission: EM | Admit: 2021-02-19 | Discharge: 2021-02-19 | Disposition: A | Discharge status: Home / Self Care | Payer: Self-pay | Attending: Emergency Medicine | Admitting: Emergency Medicine

## 2021-02-19 DIAGNOSIS — F17210 Nicotine dependence, cigarettes, uncomplicated: Secondary | ICD-10-CM | POA: Insufficient documentation

## 2021-02-19 DIAGNOSIS — H1031 Unspecified acute conjunctivitis, right eye: Secondary | ICD-10-CM | POA: Insufficient documentation

## 2021-02-19 MED ORDER — TETRACAINE HCL 0.5 % OP SOLN
2.0000 [drp] | Freq: Once | OPHTHALMIC | Status: AC
  Administered 2021-02-19: 17:00:00 2 [drp] via OPHTHALMIC
  Filled 2021-02-19: qty 4

## 2021-02-19 MED ORDER — FLUORESCEIN SODIUM 1 MG OP STRP
1.0000 | ORAL_STRIP | Freq: Once | OPHTHALMIC | Status: AC
  Administered 2021-02-19: 17:00:00 1 via OPHTHALMIC
  Filled 2021-02-19: qty 1

## 2021-02-19 MED ORDER — ERYTHROMYCIN 5 MG/GM OP OINT: TOPICAL_OINTMENT | OPHTHALMIC | 0 refills | Status: DC

## 2021-02-19 NOTE — ED Provider Notes (Signed)
[facility] EM[facility]ENC[facility] DEPT Provider Note   CSN: [id number] Arrival date & time: 02/19/21  1354     History Chief Complaint  Patient presents with   Eye Pain    Ricardo Pope is a 35 y.o. male with 2 days of right eye redness, irritation.  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  Patient denies any known irritant, or known traumatic injury to the eye.  Patient describes the eye pain is itchy, irritating, 2/10 in severity, that has been constant since he woke up yesterday morning.  Patient reports minimal improvement with eyedrops.  Patient does not report anything that makes it worse.  Patient denies purulent drainage, headache.  Patient does report some blurry vision secondary to eye irritation.  Eyes contact use.   Eye Pain      History reviewed. No pertinent past medical history.  There are no problems to display for this patient.   History reviewed. No pertinent surgical history.     No family history on file.  Social History   Tobacco Use   Smoking status: Every Day    Packs/day: 0.25    Years: 10.00    Pack years: 2.50    Types: Cigarettes   Smokeless tobacco: Never  Vaping Use   Vaping Use: Never used  Substance Use Topics   Alcohol use: Yes   Drug use: Never    Home Medications Prior to Admission medications   Medication Sig Start Date End Date Taking? Authorizing Provider  erythromycin ophthalmic ointment Place a 1/2 inch ribbon of ointment into the lower eyelid four times daily for 5 days 02/19/21  Yes Xzandria Clevinger H, PA-C  methocarbamol (ROBAXIN) 500 MG tablet Take 1 tablet (500 mg total) by mouth 2 (two) times daily. 12/19/20   Jeannie Fend, PA-C    Allergies    Patient has no known allergies.  Review of Systems   Review of Systems  Eyes:  Positive for pain.  All other systems reviewed and are negative.  Physical Exam Updated Vital Signs BP  (!) 173/107 (BP Location: Left Arm)   Pulse 95   Temp 98.4 F (36.9 C) (Oral)   Resp 16   Ht 5\' 9"  (1.753 m)   Wt 63.5 kg   SpO2 100%   BMI 20.67 kg/m   Physical Exam Vitals and nursing note reviewed.  Constitutional:      General: He is not in acute distress.    Appearance: Normal appearance.  HENT:     Head: Normocephalic and atraumatic.  Eyes:     General:        Right eye: No discharge.        Left eye: No discharge.     Comments: Right eye with some redness, irritation of the eyelids, and slight redness of the conjunctivae.  Fluorescein exam does not reveal corneal abrasion or corneal ulcer.  There is no purulent drainage or crusting.  There is no cobblestone mucosa.  Visual acuity within normal limits.  IOP 12 mm in right eye.  Cardiovascular:     Rate and Rhythm: Normal rate and regular rhythm.  Pulmonary:     Effort: Pulmonary effort is normal. No respiratory distress.  Musculoskeletal:        General: No deformity.  Skin:    General: Skin is warm and dry.  Neurological:     Mental Status: He is alert and oriented to person, place,  and time.  Psychiatric:        Mood and Affect: Mood normal.        Behavior: Behavior normal.    ED Results / Procedures / Treatments   Labs (all labs ordered are listed, but only abnormal results are displayed) Labs Reviewed - No data to display  EKG None  Radiology No results found.  Procedures Procedures   Medications Ordered in ED Medications  fluorescein ophthalmic strip 1 strip (1 strip Right Eye Given 02/19/21 1702)  tetracaine (PONTOCAINE) 0.5 % ophthalmic solution 2 drop (2 drops Right Eye Given 02/19/21 1703)    ED Course  I have reviewed the triage vital signs and the nursing notes.  Pertinent labs & imaging results that were available during my care of the patient were reviewed by me and considered in my medical decision making (see chart for details).    MDM Rules/Calculators/A&P                          Patient with 2 days of right eye redness, irritation.  Patient does not have acute pain, light sensitivity, or other findings to suggest acute angle-closure glaucoma.  He has no palpable mass or focal area of swelling to suggest chalazion or hordeolum.  Patient does not have any purulent drainage or eyelid crusting, however with his redness, irritation and some concern for possible early bacterial conjunctivitis.  Fluorescein lamp exam does not reveal any evidence of corneal abrasion, corneal ulcer.  Visual acuity within normal limits.  Patient's pain is manageable throughout entirety of visit.  Will prescribe Romycin ophthalmic ointment, encouraged follow-up with ophthalmology if his pain worsens or fails to improve.  Patient discharged in stable condition, return precautions given. Final Clinical Impression(s) / ED Diagnoses Final diagnoses:  Acute conjunctivitis of right eye, unspecified acute conjunctivitis type    Rx / DC Orders ED Discharge Orders          Ordered    erythromycin ophthalmic ointment        02/19/21 1715             Jefferey Lippmann, Harrel Carina, PA-C 02/19/21 1721    Arby Barrette, MD 02/24/21 1550

## 2021-02-19 NOTE — Discharge Instructions (Signed)
As we discussed if your eye pain worsens or fails to improve I recommend that you follow up in the ED or with an eye doctor for further evaluation. I recommend that you wear eye protection while you are at work.

## 2021-02-19 NOTE — ED Triage Notes (Signed)
Pt reports red irritated right eye since Saturday.  Pt tried OTC eye drops without relief.

## 2021-02-19 NOTE — ED Notes (Signed)
RN provided AVS using Teachback Method. Patient verbalizes understanding of Discharge Instructions. Opportunity for Questioning and Answers were provided by RN. Patient Discharged from ED ambulatory to Home via Self.  

## 2022-06-23 IMAGING — DX DG WRIST COMPLETE 3+V*R*
1 series · 4 of 4 positions shown · non-contrast
Comparison: None.

CLINICAL DATA: Right wrist pain following an injury.

EXAM:
RIGHT WRIST - COMPLETE 3+ VIEW

[Series 1: wrist · 0.14mm/px · 4 of 4 slices shown]
[im 1/4]
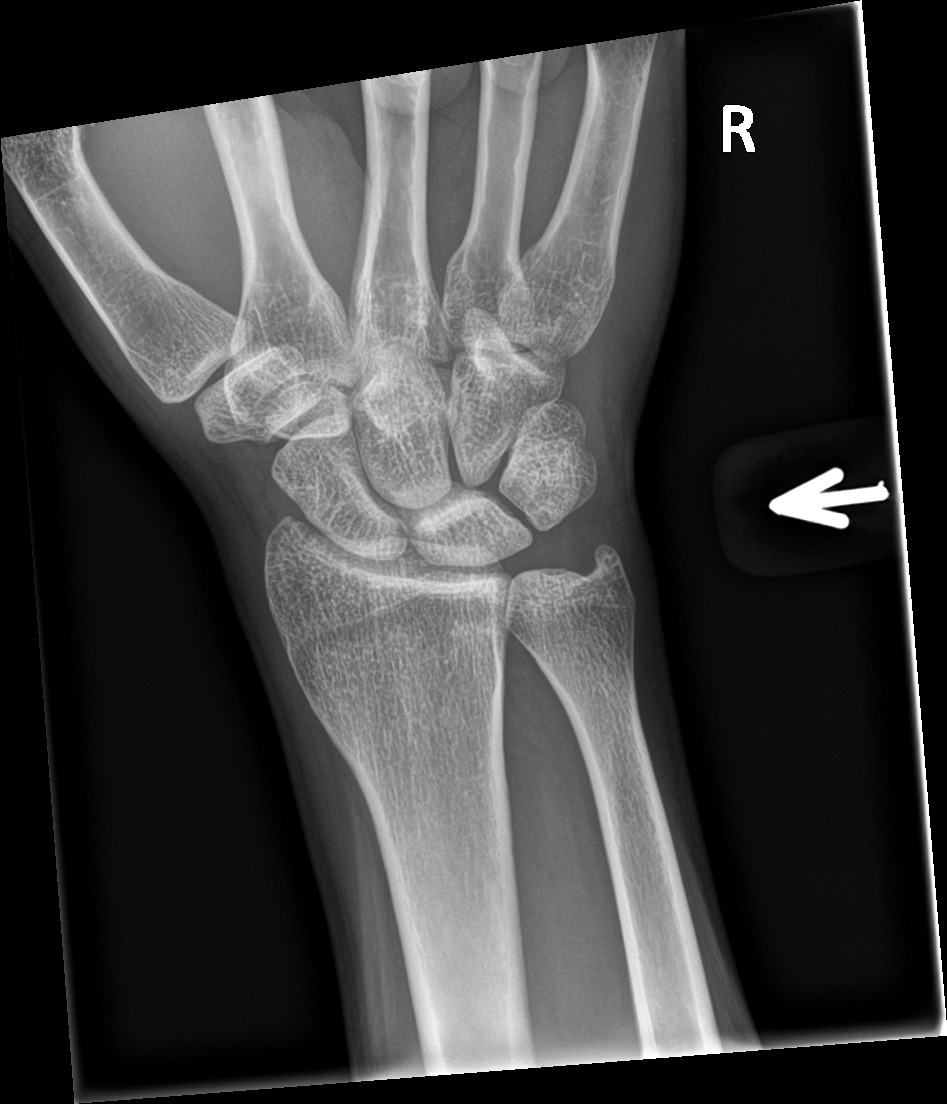
[im 2/4]
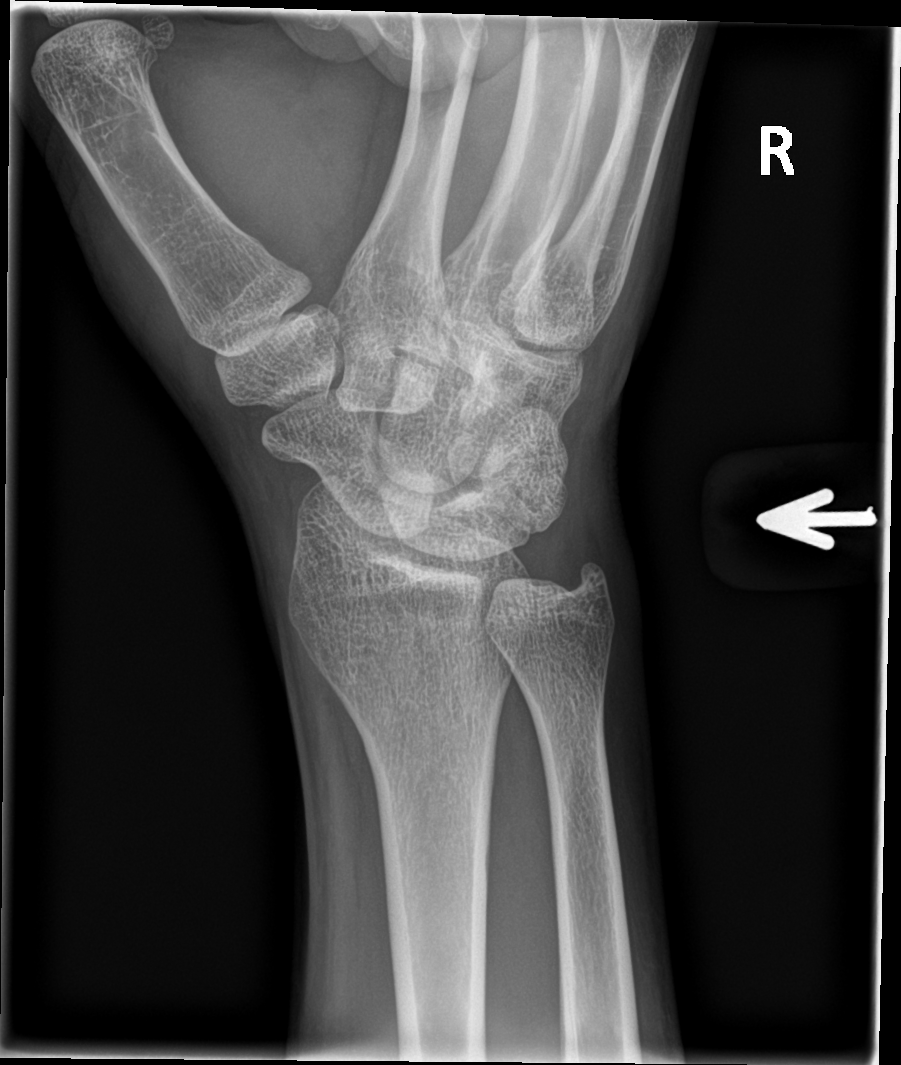
[im 3/4]
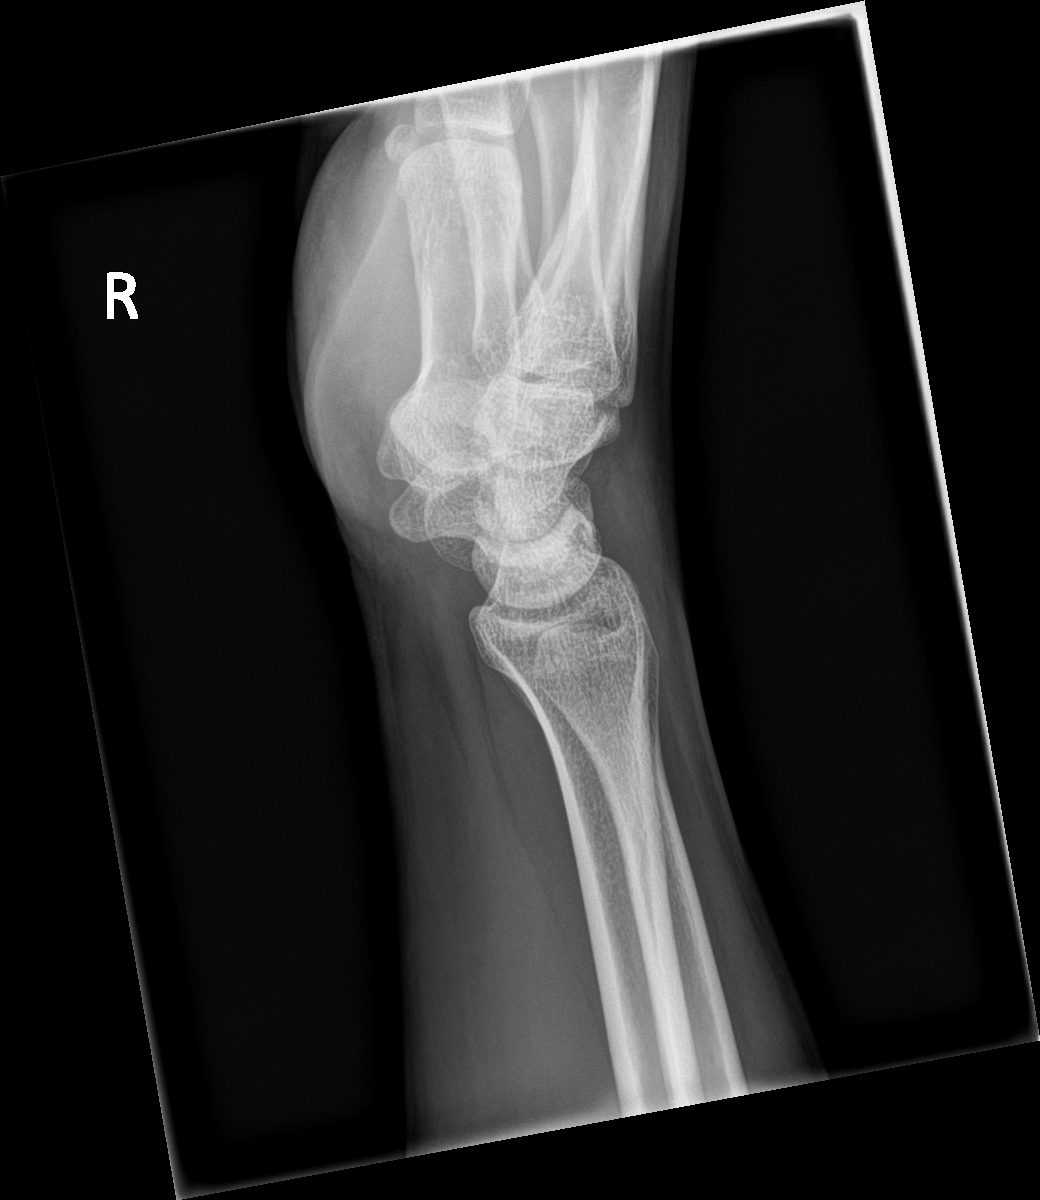
[im 4/4]
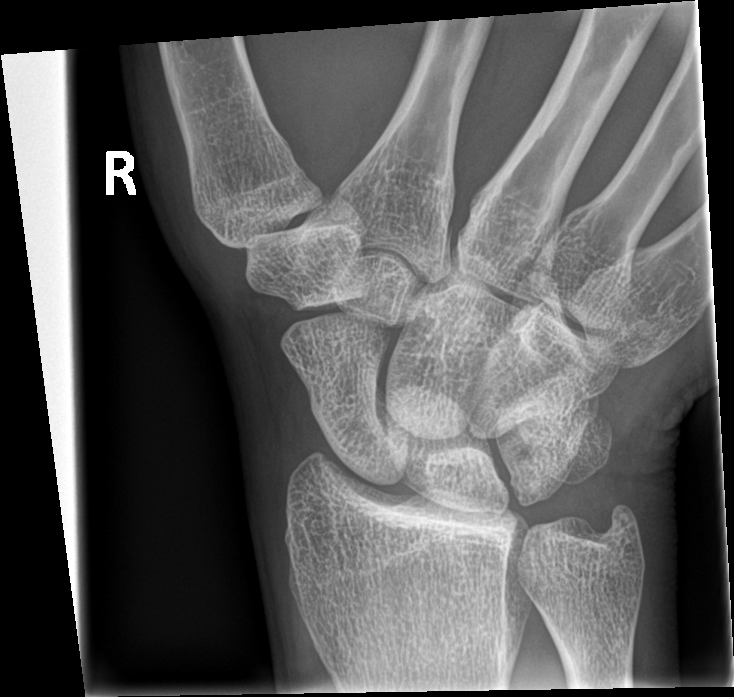

[4 of 4 positions shown; findings below may reference images not displayed]

FINDINGS: There is no evidence of fracture or dislocation. There is no
evidence of arthropathy or other focal bone abnormality. Soft
tissues are unremarkable.
IMPRESSION: Normal examination.
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
## 2023-06-09 ENCOUNTER — Emergency Department (HOSPITAL_COMMUNITY): Payer: Self-pay

## 2023-06-09 ENCOUNTER — Emergency Department (HOSPITAL_COMMUNITY)
Admission: EM | Admit: 2023-06-09 | Discharge: 2023-06-09 | Disposition: A | Discharge status: Home / Self Care | Payer: Self-pay | Attending: Emergency Medicine | Admitting: Emergency Medicine

## 2023-06-09 ENCOUNTER — Other Ambulatory Visit: Payer: Self-pay

## 2023-06-09 DIAGNOSIS — Z79899 Other long term (current) drug therapy: Secondary | ICD-10-CM | POA: Insufficient documentation

## 2023-06-09 DIAGNOSIS — Z23 Encounter for immunization: Secondary | ICD-10-CM | POA: Insufficient documentation

## 2023-06-09 DIAGNOSIS — Y907 Blood alcohol level of 200-239 mg/100 ml: Secondary | ICD-10-CM | POA: Insufficient documentation

## 2023-06-09 DIAGNOSIS — W3400XA Accidental discharge from unspecified firearms or gun, initial encounter: Secondary | ICD-10-CM | POA: Insufficient documentation

## 2023-06-09 DIAGNOSIS — F10920 Alcohol use, unspecified with intoxication, uncomplicated: Secondary | ICD-10-CM | POA: Insufficient documentation

## 2023-06-09 DIAGNOSIS — S81812A Laceration without foreign body, left lower leg, initial encounter: Secondary | ICD-10-CM | POA: Insufficient documentation

## 2023-06-09 LAB — CBC WITH DIFFERENTIAL/PLATELET
Abs Immature Granulocytes: 0.04 10*3/uL (ref 0.00–0.07)
Basophils Absolute: 0.1 10*3/uL (ref 0.0–0.1)
Basophils Relative: 1 %
Eosinophils Absolute: 0 10*3/uL (ref 0.0–0.5)
Eosinophils Relative: 0 %
HCT: 40.5 % (ref 39.0–52.0)
Hemoglobin: 13.5 g/dL (ref 13.0–17.0)
Immature Granulocytes: 1 %
Lymphocytes Relative: 20 %
Lymphs Abs: 1.4 10*3/uL (ref 0.7–4.0)
MCH: 29.4 pg (ref 26.0–34.0)
MCHC: 33.3 g/dL (ref 30.0–36.0)
MCV: 88.2 fL (ref 80.0–100.0)
Monocytes Absolute: 0.9 10*3/uL (ref 0.1–1.0)
Monocytes Relative: 13 %
Neutro Abs: 4.3 10*3/uL (ref 1.7–7.7)
Neutrophils Relative %: 65 %
Platelets: 295 10*3/uL (ref 150–400)
RBC: 4.59 MIL/uL (ref 4.22–5.81)
RDW: 14.5 % (ref 11.5–15.5)
WBC: 6.7 10*3/uL (ref 4.0–10.5)
nRBC: 0 % (ref 0.0–0.2)

## 2023-06-09 LAB — BASIC METABOLIC PANEL
Anion gap: 12 (ref 5–15)
BUN: 7 mg/dL (ref 6–20)
CO2: 22 mmol/L (ref 22–32)
Calcium: 8.5 mg/dL — ABNORMAL LOW (ref 8.9–10.3)
Chloride: 102 mmol/L (ref 98–111)
Creatinine, Ser: 0.85 mg/dL (ref 0.61–1.24)
GFR, Estimated: >60 mL/min (ref >60)
Glucose, Bld: 118 mg/dL — ABNORMAL HIGH (ref 70–99)
Potassium: 3.4 mmol/L — ABNORMAL LOW (ref 3.5–5.1)
Sodium: 136 mmol/L (ref 135–145)

## 2023-06-09 LAB — ETHANOL: Alcohol, Ethyl (B): 210 mg/dL — ABNORMAL HIGH (ref <10)

## 2023-06-09 MED ORDER — CEPHALEXIN 500 MG PO CAPS: 500.0000 mg | ORAL_CAPSULE | Freq: Three times a day (TID) | ORAL | 0 refills | Status: DC

## 2023-06-09 MED ORDER — IOHEXOL 350 MG/ML SOLN
75.0000 mL | Freq: Once | INTRAVENOUS | Status: AC | PRN
  Administered 2023-06-09: 75 mL via INTRAVENOUS

## 2023-06-09 MED ORDER — LIDOCAINE-EPINEPHRINE (PF) 2 %-1:200000 IJ SOLN
20.0000 mL | Freq: Once | INTRAMUSCULAR | Status: AC
  Administered 2023-06-09: 20 mL
  Filled 2023-06-09: qty 20

## 2023-06-09 MED ORDER — NAPROXEN 500 MG PO TABS: 500.0000 mg | ORAL_TABLET | Freq: Two times a day (BID) | ORAL | 0 refills | Status: DC

## 2023-06-09 MED ORDER — TETANUS-DIPHTH-ACELL PERTUSSIS 5-2.5-18.5 LF-MCG/0.5 IM SUSY
0.5000 mL | PREFILLED_SYRINGE | Freq: Once | INTRAMUSCULAR | Status: AC
  Administered 2023-06-09: 0.5 mL via INTRAMUSCULAR
  Filled 2023-06-09: qty 0.5

## 2023-06-09 MED ORDER — FENTANYL CITRATE PF 50 MCG/ML IJ SOSY
50.0000 ug | PREFILLED_SYRINGE | Freq: Once | INTRAMUSCULAR | Status: AC
  Administered 2023-06-09: 50 ug via INTRAVENOUS
  Filled 2023-06-09: qty 1

## 2023-06-09 NOTE — ED Notes (Signed)
 Pt verbalized understanding of discharge instructions. Opportunity for questions provided.

## 2023-06-09 NOTE — ED Notes (Signed)
 Pt noted to have removed all monitoring equipment and walking around room.
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
## 2023-06-09 NOTE — ED Notes (Addendum)
 Pt educated on CAM boot and crutches by Ortho Tech.  Pt given a paper scrub top and extra dressing supplies.  Pt's mother is coming to pick him up.
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
## 2023-06-09 NOTE — ED Notes (Signed)
Ortho Tech paged.

## 2023-06-09 NOTE — Progress Notes (Signed)
 Orthopedic Tech Progress Note Patient Details:  Ricardo Pope 09-11-1985 [id number]  Ortho Devices Type of Ortho Device: Crutches, CAM walker Ortho Device/Splint Interventions: Ordered   Post Interventions Instructions Provided: Care of device, Adjustment of device, Poper ambulation with device Unable to apply cam boot at this time due to patients wound care not yet being completed. Cam boot left at bedside and instructions on boot application given to patient. Darleen Crocker 06/09/2023, 7:35 AM

## 2023-06-09 NOTE — Discharge Instructions (Addendum)
 X-ray shows no fractures in your ankle or foot.  Take the antibiotics as prescribed.  Keep wound clean and dry.  Sutures should be removed in 10 to 14 days if they are still present.  Your CT scan shows multiple incidental findings that need follow-up.  There is an abnormal bone lesion in the bone of your back called T3 which is possibly a bone cyst but needs an MRI for further evaluation to determine what it is.  Your colon appears thick on the CT scan and he should see the gastroenterologist to ensure there is no mass in your colon.  You also have a nodule in your lung and should have a repeat CT scan in 1 year to make sure they are changing in size.

## 2023-06-10 ENCOUNTER — Telehealth: Payer: Self-pay

## 2023-06-10 NOTE — Telephone Encounter (Signed)
 Received inbound call from patient indicating he was unable to schedule an f/u PCP establishment appointment until May. This RNCM spoke with Patsy Lager with [facility] who reports no available new patient PCP appointments.  Patient has an appointment scheduled on tomorrow 06/11/23 at 8am w/Dr. Vincent/ [facility] in [place]/ [phone], patient should arrive at 7:45pm to complete paperwork.   Notified patient who agrees to appointment and will arrange transportation.   No additional TOC needs.

## 2023-06-11 ENCOUNTER — Ambulatory Visit: Payer: Self-pay | Admitting: Student in an Organized Health Care Education/Training Program

## 2023-06-11 ENCOUNTER — Encounter: Payer: Self-pay | Admitting: General Practice

## 2023-06-13 ENCOUNTER — Encounter (HOSPITAL_COMMUNITY): Payer: Self-pay

## 2023-06-18 ENCOUNTER — Ambulatory Visit: Payer: Self-pay | Admitting: Student in an Organized Health Care Education/Training Program

## 2023-06-21 ENCOUNTER — Telehealth: Payer: Self-pay | Admitting: Student in an Organized Health Care Education/Training Program

## 2023-06-21 ENCOUNTER — Encounter: Payer: Self-pay | Admitting: Student in an Organized Health Care Education/Training Program

## 2023-06-21 ENCOUNTER — Ambulatory Visit (INDEPENDENT_AMBULATORY_CARE_PROVIDER_SITE_OTHER): Payer: Self-pay | Admitting: Student in an Organized Health Care Education/Training Program

## 2023-06-21 VITALS — BP 130/94 | HR 93 | Temp 98.3°F | Wt 145.0 lb

## 2023-06-21 DIAGNOSIS — S81832A Puncture wound without foreign body, left lower leg, initial encounter: Secondary | ICD-10-CM | POA: Insufficient documentation

## 2023-06-21 DIAGNOSIS — R918 Other nonspecific abnormal finding of lung field: Secondary | ICD-10-CM

## 2023-06-21 DIAGNOSIS — M899 Disorder of bone, unspecified: Secondary | ICD-10-CM

## 2023-06-21 DIAGNOSIS — S81832D Puncture wound without foreign body, left lower leg, subsequent encounter: Secondary | ICD-10-CM

## 2023-06-21 DIAGNOSIS — K6289 Other specified diseases of anus and rectum: Secondary | ICD-10-CM

## 2023-06-21 NOTE — Telephone Encounter (Signed)
 Type of form received: Disability Forms  Additional comments:   Received by: [employer] - Front Desk  Form should be Faxed/mailed to: (address/ fax #) Fax to [phone]  Is patient requesting call for pickup: Requesting a copy be emailed to [email or web address]  Form placed:  Labeled & placed in provider bin  Attach charge sheet.  Provider will determine charge.  Individual made aware of 3-5 business day turn around Yes

## 2023-06-21 NOTE — Telephone Encounter (Signed)
 Picked papers up from front desk and have taken to Dr.Vincent to be completed and returned to nurses station.
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
## 2023-06-21 NOTE — Assessment & Plan Note (Signed)
 Incidentally noted rectal wall thickening on CT of the pelvis.  He is asymptomatic, no hematochezia or discomfort.  Given the risk for a rectal neoplasm, would like to refer to GI for colonoscopy.  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  Once he has that he agrees to the referral.
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
## 2023-06-21 NOTE — Telephone Encounter (Signed)
 Forms completed, but patient needs to fill out his part of the forms and sign the release of information.

## 2023-06-24 NOTE — Telephone Encounter (Signed)
 Have called patient x2 to come and fill paper work out, patient has not answered, not able to leave VM

## 2023-06-26 ENCOUNTER — Encounter: Payer: Self-pay | Admitting: Student in an Organized Health Care Education/Training Program

## 2023-06-26 NOTE — Telephone Encounter (Signed)
 Sent out unable to contact letter.

## 2023-06-26 NOTE — Telephone Encounter (Signed)
 3rd attempt in contacting patient to complete his part of the disability form.

## 2023-07-12 ENCOUNTER — Encounter: Payer: Self-pay | Admitting: Student in an Organized Health Care Education/Training Program

## 2023-07-12 ENCOUNTER — Ambulatory Visit: Payer: Self-pay | Admitting: Student in an Organized Health Care Education/Training Program

## 2023-07-12 VITALS — BP 130/84 | HR 110 | Temp 97.9°F

## 2023-07-12 DIAGNOSIS — M899 Disorder of bone, unspecified: Secondary | ICD-10-CM | POA: Diagnosis not present

## 2023-07-12 DIAGNOSIS — S81832D Puncture wound without foreign body, left lower leg, subsequent encounter: Secondary | ICD-10-CM

## 2023-07-12 DIAGNOSIS — K6289 Other specified diseases of anus and rectum: Secondary | ICD-10-CM | POA: Diagnosis not present
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
## 2023-07-12 NOTE — Assessment & Plan Note (Signed)
 Rectal wall thickening was incidentally noted on CT of the abdomen and pelvis.  Patient is asymptomatic, no family history of colon cancer, no history of inflammatory bowel disease.  There are some increased risk of malignancy here because incidentally noted was also a thoracic spine lesion as well as pulmonary nodules.  Will do some imaging of the spinal lesion and refer to GI for direct visualization of the rectal wall thickening.
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
